# Patient Record
Sex: Female | Born: 1980
Health system: Southern US, Community
[De-identification: ages and names within clinical notes are randomized; demographics above are authoritative.]

## PROBLEM LIST (undated history)

## (undated) DIAGNOSIS — M1990 Unspecified osteoarthritis, unspecified site: Secondary | ICD-10-CM

## (undated) DIAGNOSIS — I10 Essential (primary) hypertension: Secondary | ICD-10-CM

## (undated) DIAGNOSIS — K219 Gastro-esophageal reflux disease without esophagitis: Secondary | ICD-10-CM

## (undated) DIAGNOSIS — I499 Cardiac arrhythmia, unspecified: Secondary | ICD-10-CM

## (undated) HISTORY — PX: TUBAL LIGATION: SHX77

## (undated) HISTORY — DX: Gastro-esophageal reflux disease without esophagitis: K21.9

## (undated) HISTORY — PX: ABDOMINAL HYSTERECTOMY: SHX81

## (undated) HISTORY — PX: KNEE SURGERY: SHX244

## (undated) HISTORY — DX: Essential (primary) hypertension: I10

## (undated) HISTORY — PX: OOPHORECTOMY: SHX86

---

## 2012-04-07 ENCOUNTER — Ambulatory Visit (INDEPENDENT_AMBULATORY_CARE_PROVIDER_SITE_OTHER): Payer: BC Managed Care – PPO | Admitting: Cardiology

## 2012-04-07 ENCOUNTER — Encounter: Payer: Self-pay | Admitting: Cardiology

## 2012-04-07 VITALS — BP 131/82 | HR 74 | Ht 65.0 in | Wt 126.8 lb

## 2012-04-07 DIAGNOSIS — I517 Cardiomegaly: Secondary | ICD-10-CM | POA: Insufficient documentation

## 2012-04-07 DIAGNOSIS — I10 Essential (primary) hypertension: Secondary | ICD-10-CM

## 2012-04-07 DIAGNOSIS — R9431 Abnormal electrocardiogram [ECG] [EKG]: Secondary | ICD-10-CM

## 2012-04-07 MED ORDER — AMLODIPINE BESYLATE 10 MG PO TABS: 10.0000 mg | ORAL_TABLET | Freq: Every day | ORAL | Status: DC

## 2012-04-07 NOTE — Patient Instructions (Addendum)
The current medical regimen is effective;  continue present plan and medications.  Your physician has requested that you have a renal artery duplex. During this test, an ultrasound is used to evaluate blood flow to the kidneys. Allow one hour for this exam. Do not eat after midnight the day before and avoid carbonated beverages. Take your medications as you usually do.  Please have 24 hour urine test.  Follow up in 1 month with Dr Antoine Poche

## 2012-04-07 NOTE — Progress Notes (Signed)
HPI The patient presents for followup of an abnormal echocardiogram. She has been living in [place]. She says she's been treated for years since she was 18 for hypertension. After careful questioning it does not seem like she has had a workup for secondary causes. She has had EKGs and a treadmill test in the past. She is now relocating here. She was seen at her primary care office and has had her medications adjusted recently to include increased Norvasc. She had an echocardiogram which reports an EF of 53%, LVH moderate and some trace tricuspid regurgitation. She was referred for further workup and followup of this and her hypertension.  The patient is quite active. She doesn't exercise but she raises 2 children, works 2 jobs and goes to school. She takes care of her home. With this level of activity she denies any chest pressure, neck or arm discomfort. She does not have shortness of breath, PND or orthopnea. She does have occasional pounding of her heart beat but not sustained tachyarrhythmias or necessarily irregular arrhythmias. She might get lightheaded with this. It might feel this way if she changes position or turns her head a certain way.  No Known Allergies  Current Outpatient Prescriptions  Medication Sig Dispense Refill  . amLODipine (NORVASC) 5 MG tablet Take 5 mg by mouth daily.      . busPIRone (BUSPAR) 15 MG tablet Take 15 mg by mouth 3 (three) times daily.      Marland Kitchen lisinopril (PRINIVIL,ZESTRIL) 20 MG tablet Take 20 mg by mouth daily.      . pantoprazole (PROTONIX) 40 MG tablet Take 40 mg by mouth daily.        Past Medical History  Diagnosis Date  . Palpitations   . Acid reflux     Past Surgical History  Procedure Date  . Abdominal hysterectomy     No family history on file.  History   Social History  . Marital Status: Married    Spouse Name: N/A    Number of Children: N/A  . Years of Education: N/A   Occupational History  . Not on file.   Social  History Main Topics  . Smoking status: Former Smoker    Quit date: 12/23/2011  . Smokeless tobacco: Not on file  . Alcohol Use: Not on file  . Drug Use: Not on file  . Sexually Active: Not on file   Other Topics Concern  . Not on file   Social History Narrative  . No narrative on file    ROS:  Positive for urinary frequency. Otherwise as stated in the history of present illness and negative for all other systems.  PHYSICAL EXAM BP 131/82  Pulse 74  Ht 5\' 5"  (1.651 m)  Wt 126 lb 12.8 oz (57.516 kg)  BMI 21.10 kg/m2 GENERAL:  Well appearing HEENT:  Pupils equal round and reactive, fundi not visualized, oral mucosa unremarkable NECK:  No jugular venous distention, waveform within normal limits, carotid upstroke brisk and symmetric, no bruits, no thyromegaly LYMPHATICS:  No cervical, inguinal adenopathy LUNGS:  Clear to auscultation bilaterally BACK:  No CVA tenderness CHEST:  Unremarkable HEART:  PMI not displaced or sustained,S1 and S2 within normal limits, no S3, no S4, no clicks, no rubs, no murmurs ABD:  Flat, positive bowel sounds normal in frequency in pitch, no bruits, no rebound, no guarding, no midline pulsatile mass, no hepatomegaly, no splenomegaly EXT:  2 plus pulses throughout, no edema, no cyanosis no clubbing SKIN:  No  rashes no nodules NEURO:  Cranial nerves II through XII grossly intact, motor grossly intact throughout PSYCH:  Cognitively intact, oriented to person place and time   EKG:  Sinus rhythm, rate 74, axis within normal limits, intervals within normal limits, no acute ST-T wave changes.  04/07/2012   ASSESSMENT AND PLAN  Hypertension - At this point I will pursue a workup for secondary causes starting with 24 hour urine for VMA and metanephrines and renal Dopplers to look for in particular fibromuscular dysplasia. She thinks she's not responding well to the 10 mg of Norvasc and that is contributing to her palpitations. I will change the timing of  this from morning to night to see if this helps but she will remain on the other medicines.  Of note last chemistry and TSH were normal.  Abnormal echocardiogram - She has a low ejection fraction but still low normal. I would not suspect ischemia. She does have evidence of end organ involvement with her hypertension would be LVH. She has no significant valvular abnormalities though I need to review the final report. At this point I'm not thinking she needs further workup other than above and a followup echo in the future. She certainly needs continued blood pressure control.  Palpitations - I will first manage this with changing the timing of her medications as listed above. Further workup will be based on future symptoms.

## 2012-04-13 ENCOUNTER — Other Ambulatory Visit: Payer: BC Managed Care – PPO

## 2012-04-17 LAB — CATECHOLAMINES, FRACTIONATED, URINE, 24 HOUR
Dopamine, 24 hr Urine: 140
Epinephrine, 24 hr Urine: 6.3
Total Volume - CF 24Hr U: 1500

## 2012-04-24 ENCOUNTER — Encounter (INDEPENDENT_AMBULATORY_CARE_PROVIDER_SITE_OTHER): Payer: BC Managed Care – PPO

## 2012-04-24 DIAGNOSIS — I10 Essential (primary) hypertension: Secondary | ICD-10-CM

## 2012-05-11 ENCOUNTER — Ambulatory Visit (INDEPENDENT_AMBULATORY_CARE_PROVIDER_SITE_OTHER): Payer: BC Managed Care – PPO | Admitting: Cardiology

## 2012-05-11 ENCOUNTER — Encounter: Payer: Self-pay | Admitting: Cardiology

## 2012-05-11 VITALS — BP 120/80 | HR 85 | Ht 65.0 in | Wt 131.2 lb

## 2012-05-11 DIAGNOSIS — I517 Cardiomegaly: Secondary | ICD-10-CM

## 2012-05-11 DIAGNOSIS — I10 Essential (primary) hypertension: Secondary | ICD-10-CM

## 2012-05-11 NOTE — Patient Instructions (Addendum)
The current medical regimen is effective;  continue present plan and medications.  Follow up in 6 months with Dr Hochrein.  You will receive a letter in the mail 2 months before you are due.  Please call us when you receive this letter to schedule your follow up appointment.  

## 2012-05-11 NOTE — Progress Notes (Signed)
   HPI The patient presents for a second visit with me to followup of an abnormal echocardiogram. She has been living in [place]. She says she's been treated for years since she was 18 for hypertension.  She has had EKGs and a treadmill test in the past. She has relocated here.. A recent echocardiogram repored an EF of 53%, LVH moderate and some trace tricuspid regurgitation.  I sent her for renal Dopplers which were negative for any evidence of obstruction. 24 hour urine for VMA and metanephrines was negative.  Since I last saw her she has done well.  The patient denies any new symptoms such as chest discomfort, neck or arm discomfort. There has been no new shortness of breath, PND or orthopnea. There have been no reported palpitations, presyncope or syncope.  No Known Allergies  Current Outpatient Prescriptions  Medication Sig Dispense Refill  . amLODipine (NORVASC) 10 MG tablet Take 1 tablet (10 mg total) by mouth daily.      . busPIRone (BUSPAR) 15 MG tablet Take 15 mg by mouth 3 (three) times daily.      Marland Kitchen lisinopril (PRINIVIL,ZESTRIL) 20 MG tablet Take 20 mg by mouth daily.      . pantoprazole (PROTONIX) 40 MG tablet Take 40 mg by mouth daily.      Marland Kitchen zolpidem (AMBIEN) 10 MG tablet Take 10 mg by mouth at bedtime as needed.        Past Medical History  Diagnosis Date  . HTN (hypertension)   . Acid reflux     Past Surgical History  Procedure Date  . Abdominal hysterectomy     ROS:  Positive for urinary frequency. Otherwise as stated in the history of present illness and negative for all other systems.  PHYSICAL EXAM BP 120/80  Pulse 85  Ht 5\' 5"  (1.651 m)  Wt 131 lb 3.2 oz (59.512 kg)  BMI 21.83 kg/m2 GENERAL:  Well appearing HEENT:  Pupils equal round and reactive, fundi not visualized, oral mucosa unremarkable NECK:  No jugular venous distention, waveform within normal limits, carotid upstroke brisk and symmetric, no bruits, no thyromegaly LUNGS:  Clear to  auscultation bilaterally BACK:  No CVA tenderness CHEST:  Unremarkable HEART:  PMI not displaced or sustained,S1 and S2 within normal limits, no S3, no S4, no clicks, no rubs, no murmurs ABD:  Flat, positive bowel sounds normal in frequency in pitch, no bruits, no rebound, no guarding, no midline pulsatile mass, no hepatomegaly, no splenomegaly EXT:  2 plus pulses throughout, no edema, no cyanosis no clubbing  EKG:  Sinus rhythm, rate 74, axis within normal limits, intervals within normal limits, no acute ST-T wave changes.  05/11/2012   ASSESSMENT AND PLAN  Hypertension - Her workup for secondary causes has been negative. Her blood pressure seems to be well-controlled regimen. No change in therapy is indicated.  Abnormal echocardiogram - She has LVH but this can regress as we control her BP.  We will reassess this in the future.   Palpitations - She has had no new symptoms. No further cardiovascular testing is suggested.

## 2012-09-19 ENCOUNTER — Emergency Department: Payer: Self-pay | Admitting: Emergency Medicine

## 2013-03-30 ENCOUNTER — Emergency Department: Payer: Self-pay | Admitting: Emergency Medicine

## 2014-08-25 ENCOUNTER — Emergency Department: Payer: Self-pay | Admitting: Emergency Medicine

## 2015-01-30 ENCOUNTER — Emergency Department: Payer: BLUE CROSS/BLUE SHIELD

## 2015-01-30 ENCOUNTER — Emergency Department
Admission: EM | Admit: 2015-01-30 | Discharge: 2015-01-30 | Disposition: A | Discharge status: Home / Self Care | Payer: BLUE CROSS/BLUE SHIELD | Attending: Emergency Medicine | Admitting: Emergency Medicine

## 2015-01-30 ENCOUNTER — Encounter: Payer: Self-pay | Admitting: Urgent Care

## 2015-01-30 DIAGNOSIS — M25562 Pain in left knee: Secondary | ICD-10-CM

## 2015-01-30 DIAGNOSIS — S8992XA Unspecified injury of left lower leg, initial encounter: Secondary | ICD-10-CM | POA: Diagnosis present

## 2015-01-30 DIAGNOSIS — I10 Essential (primary) hypertension: Secondary | ICD-10-CM | POA: Diagnosis not present

## 2015-01-30 DIAGNOSIS — Y998 Other external cause status: Secondary | ICD-10-CM | POA: Diagnosis not present

## 2015-01-30 DIAGNOSIS — X58XXXA Exposure to other specified factors, initial encounter: Secondary | ICD-10-CM | POA: Diagnosis not present

## 2015-01-30 DIAGNOSIS — Y9389 Activity, other specified: Secondary | ICD-10-CM | POA: Diagnosis not present

## 2015-01-30 DIAGNOSIS — Z720 Tobacco use: Secondary | ICD-10-CM | POA: Diagnosis not present

## 2015-01-30 DIAGNOSIS — S83207A Unspecified tear of unspecified meniscus, current injury, left knee, initial encounter: Secondary | ICD-10-CM | POA: Insufficient documentation

## 2015-01-30 DIAGNOSIS — Y9289 Other specified places as the place of occurrence of the external cause: Secondary | ICD-10-CM | POA: Diagnosis not present

## 2015-01-30 MED ORDER — IBUPROFEN 800 MG PO TABS: 800.0000 mg | ORAL_TABLET | Freq: Three times a day (TID) | ORAL | Status: DC | PRN

## 2015-01-30 MED ORDER — OXYCODONE-ACETAMINOPHEN 5-325 MG PO TABS: 1.0000 | ORAL_TABLET | Freq: Four times a day (QID) | ORAL | Status: DC | PRN

## 2015-01-30 NOTE — ED Notes (Signed)
Patient transported to X-ray 

## 2015-01-30 NOTE — ED Provider Notes (Signed)
[facility] Emergency Department Provider Note     Time seen: ----------------------------------------- 8:07 PM on 01/30/2015 -----------------------------------------    I have reviewed the triage vital signs and the nursing notes.   HISTORY  Chief Complaint Knee Pain    HPI Katie Lambert is a 34 y.o. female who presents ER after left knee pain. Patient reports she knelt down to kiss her daughter and she felt a severe pop and pain in her left knee. Patient reports pain mostly over the lateral aspect of the knee, has had several knee surgeries particularly on the left knee before. Denies any other complaints.   Past Medical History  Diagnosis Date  . HTN (hypertension)   . Acid reflux     Patient Active Problem List   Diagnosis Date Noted  . HTN (hypertension) 05/11/2012  . Left ventricular hypertrophy 04/07/2012    Past Surgical History  Procedure Laterality Date  . Abdominal hysterectomy    . Knee surgery Left     x 2    Allergies Review of patient's allergies indicates no known allergies.  Social History History  Substance Use Topics  . Smoking status: Current Every Day Smoker -- 1.00 packs/day for 15 years    Types: Cigarettes  . Smokeless tobacco: Not on file     Comment: Currently electronic cigarettes  . Alcohol Use: Yes    Review of Systems Constitutional: Negative for fever. Musculoskeletal: Positive for left knee pain Skin: Negative for rash. Neurological: Negative for headaches, focal weakness or numbness.   ____________________________________________   PHYSICAL EXAM:  VITAL SIGNS: ED Triage Vitals  Enc Vitals Group     BP 01/30/15 1958 144/96 mmHg     Pulse Rate 01/30/15 1958 86     Resp 01/30/15 1958 16     Temp --      Temp src --      SpO2 01/30/15 1958 97 %     Weight 01/30/15 1958 140 lb (63.504 kg)     Height 01/30/15 1958  (1.651 m)     Head Cir --      Peak Flow --      Pain Score  01/30/15 1958 8     Pain Loc --      Pain Edu? --      Excl. in GC? --     Constitutional: Alert and oriented. Well appearing and in no distress. Eyes: Conjunctivae are normal. Musculoskeletal: There is no obvious left knee effusion, patella is appropriate position. There is no pain with manipulation of the patella. Positive pain with lateral stress on the knee, pain relief with medial stress on a compartment. Negative anterior posterior drawer. Skin:  Skin is warm, dry and intact. No rash noted. ____________________________________________  ED COURSE:  Pertinent labs & imaging results that were available during my care of the patient were reviewed by me and considered in my medical decision making (see chart for details). Patient likely with meniscus injury versus LCL tear. We'll need left knee x-rays ____________________________________________    RADIOLOGY Images were viewed by me  Left knee x-rays are unremarkable  ____________________________________________  FINAL ASSESSMENT AND PLAN  Left knee pain  Plan: Patient with labs and imaging as dictated above. I suspect a meniscus injury over the lateral aspect of the knee. She is placed in knee immobilizer and crutches, she'll have referral to orthopedics. She'll need an MRI of the knee, Motrin and pain medication.   Emily Filbert, MD  Emily Filbert, MD 01/30/15 2101

## 2015-01-30 NOTE — ED Notes (Signed)
Patient presents with c/o LEFT knee pain. Patient reports that she "knelt down to kiss her child" and knee popped out of place. PMH significant for previous injuries to the same knee; has had 2 procedures in the past.

## 2015-01-30 NOTE — Discharge Instructions (Signed)
Knee Bracing °Knee braces are supports to help stabilize and protect an injured or painful knee. They come in many different styles. They should support and protect the knee without increasing the chance of other injuries to yourself or others. It is important not to have a false sense of security when using a brace. Knee braces that help you to keep using your knee: °· Do not restore normal knee stability under high stress forces. °· May decrease some aspects of athletic performance. °Some of the different types of knee braces are: °· Prophylactic knee braces are designed to prevent or reduce the severity of knee injuries during sports that make injury to the knee more likely. °· Rehabilitative knee braces are designed to allow protected motion of: °¨ Injured knees. °¨ Knees that have been treated with or without surgery. °There is no evidence that the use of a supportive knee brace protects the graft following a successful anterior cruciate ligament (ACL) reconstruction. However, braces are sometimes used to:  °· Protect injured ligaments. °· Control knee movement during the initial healing period. °They may be used as part of the treatment program for the various injured ligaments or cartilage of the knee including the: °· Anterior cruciate ligament. °· Medial collateral ligament. °· Medial or lateral cartilage (meniscus). °· Posterior cruciate ligament. °· Lateral collateral ligament. °Rehabilitative knee braces are most commonly used: °· During crutch-assisted walking right after injury. °· During crutch-assisted walking right after surgery to repair the cartilage and/or cruciate ligament injury. °· For a short period of time, 2-8 weeks, after the injury or surgery. °The value of a rehabilitative brace as opposed to a cast or splint includes the: °· Ability to adjust the brace for swelling. °· Ability to remove the brace for examinations, icing, or showering. °· Ability to allow for movement in a controlled  range of motion. °Functional knee braces give support to knees that have already been injured. They are designed to provide stability for the injured knee and provide protection after repair. Functional knee braces may not affect performance much. Lower extremity muscle strengthening, flexibility, and improvement in technique are more important than bracing in treating ligamentous knee injuries. Functional braces are not a substitute for rehabilitation or surgical procedures. °Unloader/off-loader braces are designed to provide pain relief in arthritic knees. Patients with wear and tear arthritis from growing old or from an old cartilage injury (osteoarthritis) of the knee, and bowlegged (varus) or knock-knee (valgus) deformities, often develop increased pain in the arthritic side due to increased loading. Unloader/off-loader braces are made to reduce uneven loading in such knees. There is reduction in bowing out movement in bowlegged knees when the correct unloader brace is used. Patients with advanced osteoarthritis or severe varus or valgus alignment problems would not likely benefit from bracing. °Patellofemoral braces help the kneecap to move smoothly and well centered over the end of the femur in the knee.  °Most people who wear knee braces feel that they help. However, there is a lack of scientific evidence that knee braces are helpful at the level needed for athletic participation to prevent injury. In spite of this, athletes report an increase in knee stability, pain relief, performance improvement, and confidence during athletics when using a brace.  °Different knee problems require different knee braces: °· Your caregiver may suggest one kind of knee brace after knee surgery. °· A caregiver may choose another kind of knee brace for support instead of surgery for some types of torn ligaments. °· You may   also need one for pain in the front of your knee that is not getting better with strengthening and  flexibility exercises. °Get your caregiver's advice if you want to try a knee brace. The caregiver will advise you on where to get them and provide a prescription when it is needed to fashion and/or fit the brace. °Knee braces are the least important part of preventing knee injuries or getting better following injury. Stretching, strengthening and technique improvement are far more important in caring for and preventing knee injuries. When strengthening your knee, increase your activities a little at a time so as not to develop injuries from overuse. Work out an exercise plan with your caregiver and/or physical therapist to get the best program for you. Do not let a knee brace become a crutch. °Always remember, there are no braces which support the knee as well as your original ligaments and cartilage you were born with. Conditioning, proper warm-up, and stretching remain the most important parts of keeping your knees healthy. °HOW TO USE A KNEE BRACE °· During sports, knee braces should be used as directed by your caregiver. °· Make sure that the hinges are where the knee bends. °· Straps, tapes, or hook-and-loop tapes should be fastened around your leg as instructed. °· You should check the placement of the brace during activities to make sure that it has not moved. Poorly positioned braces can hurt rather than help you. °· To work well, a knee brace should be worn during all activities that put you at risk of knee injury. °· Warm up properly before beginning athletic activities. °HOME CARE INSTRUCTIONS °· Knee braces often get damaged during normal use. Replace worn-out braces for maximum benefit. °· Clean regularly with soap and water. °· Inspect your brace often for wear and tear. °· Cover exposed metal to protect others from injury. °· Durable materials may cost more, but last longer. °SEEK IMMEDIATE MEDICAL CARE IF:  °· Your knee seems to be getting worse rather than better. °· You have increasing pain or  swelling in the knee. °· You have problems caused by the knee brace. °· You have increased swelling or inflammation (redness or soreness) in your knee. °· Your knee becomes warm and more painful and you develop an unexplained temperature over 101°F (38.3°C). °MAKE SURE YOU:  °· Understand these instructions. °· Will watch your condition. °· Will get help right away if you are not doing well or get worse. °See your caregiver, physical therapist, or orthopedic surgeon for additional information. °Document Released: 08/31/2003 Document Revised: 10/25/2013 Document Reviewed: 12/07/2008 °[employer]® Patient Information ©2015 [employer]. This information is not intended to replace advice given to you by your health care provider. Make sure you discuss any questions you have with your health care provider. ° °Knee Pain °The knee is the complex joint between your thigh and your lower leg. It is made up of bones, tendons, ligaments, and cartilage. The bones that make up the knee are: °· The femur in the thigh. °· The tibia and fibula in the lower leg. °· The patella or kneecap riding in the groove on the lower femur. °CAUSES  °Knee pain is a common complaint with many causes. A few of these causes are: °· Injury, such as: °¨ A ruptured ligament or tendon injury. °¨ Torn cartilage. °· Medical conditions, such as: °¨ Gout °¨ Arthritis °¨ Infections °· Overuse, over training, or overdoing a physical activity. °Knee pain can be minor or severe. Knee pain can   accompany debilitating injury. Minor knee problems often respond well to self-care measures or get well on their own. More serious injuries may need medical intervention or even surgery. °SYMPTOMS °The knee is complex. Symptoms of knee problems can vary widely. Some of the problems are: °· Pain with movement and weight bearing. °· Swelling and tenderness. °· Buckling of the knee. °· Inability to straighten or extend your knee. °· Your knee locks and you cannot straighten  it. °· Warmth and redness with pain and fever. °· Deformity or dislocation of the kneecap. °DIAGNOSIS  °Determining what is wrong may be very straight forward such as when there is an injury. It can also be challenging because of the complexity of the knee. Tests to make a diagnosis may include: °· Your caregiver taking a history and doing a physical exam. °· Routine X-rays can be used to rule out other problems. X-rays will not reveal a cartilage tear. Some injuries of the knee can be diagnosed by: °¨ Arthroscopy a surgical technique by which a small video camera is inserted through tiny incisions on the sides of the knee. This procedure is used to examine and repair internal knee joint problems. Tiny instruments can be used during arthroscopy to repair the torn knee cartilage (meniscus). °¨ Arthrography is a radiology technique. A contrast liquid is directly injected into the knee joint. Internal structures of the knee joint then become visible on X-ray film. °¨ An MRI scan is a non X-ray radiology procedure in which magnetic fields and a computer produce two- or three-dimensional images of the inside of the knee. Cartilage tears are often visible using an MRI scanner. MRI scans have largely replaced arthrography in diagnosing cartilage tears of the knee. °· Blood work. °· Examination of the fluid that helps to lubricate the knee joint (synovial fluid). This is done by taking a sample out using a needle and a syringe. °TREATMENT °The treatment of knee problems depends on the cause. Some of these treatments are: °· Depending on the injury, proper casting, splinting, surgery, or physical therapy care will be needed. °· Give yourself adequate recovery time. Do not overuse your joints. If you begin to get sore during workout routines, back off. Slow down or do fewer repetitions. °· For repetitive activities such as cycling or running, maintain your strength and nutrition. °· Alternate muscle groups. For example, if  you are a weight lifter, work the upper body on one day and the lower body the next. °· Either tight or weak muscles do not give the proper support for your knee. Tight or weak muscles do not absorb the stress placed on the knee joint. Keep the muscles surrounding the knee strong. °· Take care of mechanical problems. °¨ If you have flat feet, orthotics or special shoes may help. See your caregiver if you need help. °¨ Arch supports, sometimes with wedges on the inner or outer aspect of the heel, can help. These can shift pressure away from the side of the knee most bothered by osteoarthritis. °¨ A brace called an "unloader" brace also may be used to help ease the pressure on the most arthritic side of the knee. °· If your caregiver has prescribed crutches, braces, wraps or ice, use as directed. The acronym for this is PRICE. This means protection, rest, ice, compression, and elevation. °· Nonsteroidal anti-inflammatory drugs (NSAIDs), can help relieve pain. But if taken immediately after an injury, they may actually increase swelling. Take NSAIDs with food in your stomach. Stop them   if you develop stomach problems. Do not take these if you have a history of ulcers, stomach pain, or bleeding from the bowel. Do not take without your caregiver's approval if you have problems with fluid retention, heart failure, or kidney problems. °· For ongoing knee problems, physical therapy may be helpful. °· Glucosamine and chondroitin are over-the-counter dietary supplements. Both may help relieve the pain of osteoarthritis in the knee. These medicines are different from the usual anti-inflammatory drugs. Glucosamine may decrease the rate of cartilage destruction. °· Injections of a corticosteroid drug into your knee joint may help reduce the symptoms of an arthritis flare-up. They may provide pain relief that lasts a few months. You may have to wait a few months between injections. The injections do have a small increased risk of  infection, water retention, and elevated blood sugar levels. °· Hyaluronic acid injected into damaged joints may ease pain and provide lubrication. These injections may work by reducing inflammation. A series of shots may give relief for as long as 6 months. °· Topical painkillers. Applying certain ointments to your skin may help relieve the pain and stiffness of osteoarthritis. Ask your pharmacist for suggestions. Many over the-counter products are approved for temporary relief of arthritis pain. °· In some countries, doctors often prescribe topical NSAIDs for relief of chronic conditions such as arthritis and tendinitis. A review of treatment with NSAID creams found that they worked as well as oral medications but without the serious side effects. °PREVENTION °· Maintain a healthy weight. Extra pounds put more strain on your joints. °· Get strong, stay limber. Weak muscles are a common cause of knee injuries. Stretching is important. Include flexibility exercises in your workouts. °· Be smart about exercise. If you have osteoarthritis, chronic knee pain or recurring injuries, you may need to change the way you exercise. This does not mean you have to stop being active. If your knees ache after jogging or playing basketball, consider switching to swimming, water aerobics, or other low-impact activities, at least for a few days a week. Sometimes limiting high-impact activities will provide relief. °· Make sure your shoes fit well. Choose footwear that is right for your sport. °· Protect your knees. Use the proper gear for knee-sensitive activities. Use kneepads when playing volleyball or laying carpet. Buckle your seat belt every time you drive. Most shattered kneecaps occur in car accidents. °· Rest when you are tired. °SEEK MEDICAL CARE IF:  °You have knee pain that is continual and does not seem to be getting better.  °SEEK IMMEDIATE MEDICAL CARE IF:  °Your knee joint feels hot to the touch and you have a high  fever. °MAKE SURE YOU:  °· Understand these instructions. °· Will watch your condition. °· Will get help right away if you are not doing well or get worse. °Document Released: 04/07/2007 Document Revised: 09/02/2011 Document Reviewed: 04/07/2007 °[employer]® Patient Information ©2015 [employer]. This information is not intended to replace advice given to you by your health care provider. Make sure you discuss any questions you have with your health care provider. ° °

## 2015-01-31 ENCOUNTER — Other Ambulatory Visit: Payer: Self-pay | Admitting: Physician Assistant

## 2015-01-31 DIAGNOSIS — M25562 Pain in left knee: Secondary | ICD-10-CM

## 2015-02-01 ENCOUNTER — Ambulatory Visit
Admission: RE | Admit: 2015-02-01 | Discharge: 2015-02-01 | Disposition: A | Discharge status: Home / Self Care | Payer: BLUE CROSS/BLUE SHIELD | Source: Ambulatory Visit | Attending: Physician Assistant | Admitting: Physician Assistant

## 2015-02-01 DIAGNOSIS — S83272A Complex tear of lateral meniscus, current injury, left knee, initial encounter: Secondary | ICD-10-CM | POA: Insufficient documentation

## 2015-02-01 DIAGNOSIS — M25562 Pain in left knee: Secondary | ICD-10-CM | POA: Diagnosis present

## 2015-02-01 DIAGNOSIS — M25862 Other specified joint disorders, left knee: Secondary | ICD-10-CM | POA: Insufficient documentation

## 2015-02-01 DIAGNOSIS — M1712 Unilateral primary osteoarthritis, left knee: Secondary | ICD-10-CM | POA: Insufficient documentation

## 2015-03-01 ENCOUNTER — Encounter
Admission: RE | Admit: 2015-03-01 | Discharge: 2015-03-01 | Disposition: A | Discharge status: Home / Self Care | Payer: BLUE CROSS/BLUE SHIELD | Source: Ambulatory Visit | Attending: Orthopedic Surgery | Admitting: Orthopedic Surgery

## 2015-03-01 DIAGNOSIS — Z01810 Encounter for preprocedural cardiovascular examination: Secondary | ICD-10-CM | POA: Diagnosis present

## 2015-03-01 DIAGNOSIS — Z01812 Encounter for preprocedural laboratory examination: Secondary | ICD-10-CM | POA: Insufficient documentation

## 2015-03-01 HISTORY — DX: Unspecified osteoarthritis, unspecified site: M19.90

## 2015-03-01 HISTORY — DX: Cardiac arrhythmia, unspecified: I49.9

## 2015-03-01 LAB — URINALYSIS COMPLETE WITH MICROSCOPIC (ARMC ONLY)
BACTERIA UA: NONE SEEN
Bilirubin Urine: NEGATIVE
Glucose, UA: NEGATIVE mg/dL
Hgb urine dipstick: NEGATIVE
Ketones, ur: NEGATIVE mg/dL
Leukocytes, UA: NEGATIVE
Nitrite: NEGATIVE
PROTEIN: NEGATIVE mg/dL
SQUAMOUS EPITHELIAL / LPF: NONE SEEN
Specific Gravity, Urine: 1.005 (ref 1.005–1.030)
pH: 7 (ref 5.0–8.0)

## 2015-03-01 LAB — CBC
HCT: 42 % (ref 35.0–47.0)
Hemoglobin: 14 g/dL (ref 12.0–16.0)
MCH: 31.1 pg (ref 26.0–34.0)
MCHC: 33.2 g/dL (ref 32.0–36.0)
MCV: 93.4 fL (ref 80.0–100.0)
PLATELETS: 227 10*3/uL (ref 150–440)
RBC: 4.49 MIL/uL (ref 3.80–5.20)
RDW: 12.8 % (ref 11.5–14.5)
WBC: 7.2 10*3/uL (ref 3.6–11.0)

## 2015-03-01 LAB — BASIC METABOLIC PANEL
Anion gap: 8 (ref 5–15)
BUN: 13 mg/dL (ref 6–20)
CALCIUM: 9.5 mg/dL (ref 8.9–10.3)
CO2: 28 mmol/L (ref 22–32)
CREATININE: 0.77 mg/dL (ref 0.44–1.00)
Chloride: 103 mmol/L (ref 101–111)
GFR calc non Af Amer: >60 mL/min (ref >60)
Glucose, Bld: 94 mg/dL (ref 65–99)
Potassium: 3.8 mmol/L (ref 3.5–5.1)
SODIUM: 139 mmol/L (ref 135–145)

## 2015-03-01 LAB — APTT: aPTT: 27 seconds (ref 24–36)

## 2015-03-01 LAB — PROTIME-INR
INR: 0.95
PROTHROMBIN TIME: 12.9 s (ref 11.4–15.0)

## 2015-03-01 NOTE — Patient Instructions (Signed)
  Your procedure is scheduled on: March 08, 2015 (Wednesday) Report to Day Surgery. [facility]) To find out your arrival time please call [phone] between 1PM - 3PM on March 07, 2015 (Tuesday).  Remember: Instructions that are not followed completely may result in serious medical risk, up to and including death, or upon the discretion of your surgeon and anesthesiologist your surgery may need to be rescheduled.    __x__ 1. Do not eat food or drink liquids after midnight. No gum chewing or hard candies.     __x__ 2. No Alcohol for 24 hours before or after surgery.   ____ 3. Bring all medications with you on the day of surgery if instructed.    __x__ 4. Notify your doctor if there is any change in your medical condition     (cold, fever, infections).     Do not wear jewelry, make-up, hairpins, clips or nail polish.  Do not wear lotions, powders, or perfumes. You may wear deodorant.  Do not shave 48 hours prior to surgery. Men may shave face and neck.  Do not bring valuables to the hospital.    [facility] is not responsible for any belongings or valuables.               Contacts, dentures or bridgework may not be worn into surgery.  Leave your suitcase in the car. After surgery it may be brought to your room.  For patients admitted to the hospital, discharge time is determined by your                treatment team.   Patients discharged the day of surgery will not be allowed to drive home.   Please read over the following fact sheets that you were given:   Surgical Site Infection Prevention   ____ Take these medicines the morning of surgery with A SIP OF WATER:    1. Lisinopril  2.   3.   4.  5.  6.  ____ Fleet Enema (as directed)   __x__ Use CHG Soap as directed  ____ Use inhalers on the day of surgery  ____ Stop metformin 2 days prior to surgery    ____ Take 1/2 of usual insulin dose the night before surgery and none on the morning of surgery.   ____  Stop Coumadin/Plavix/aspirin on   __x__ Stop Anti-inflammatories on (STOP IBUPROFEN NOW)   ____ Stop supplements until after surgery.    ____ Bring C-Pap to the hospital.

## 2015-03-08 ENCOUNTER — Ambulatory Visit: Payer: BLUE CROSS/BLUE SHIELD | Admitting: Anesthesiology

## 2015-03-08 ENCOUNTER — Encounter: Payer: Self-pay | Admitting: *Deleted

## 2015-03-08 ENCOUNTER — Encounter
Admission: RE | Disposition: A | Discharge status: Home / Self Care | Payer: Self-pay | Source: Ambulatory Visit | Attending: Orthopedic Surgery

## 2015-03-08 ENCOUNTER — Ambulatory Visit
Admission: RE | Admit: 2015-03-08 | Discharge: 2015-03-08 | Disposition: A | Discharge status: Home / Self Care | Payer: BLUE CROSS/BLUE SHIELD | Source: Ambulatory Visit | Attending: Orthopedic Surgery | Admitting: Orthopedic Surgery

## 2015-03-08 DIAGNOSIS — S83289A Other tear of lateral meniscus, current injury, unspecified knee, initial encounter: Secondary | ICD-10-CM | POA: Diagnosis present

## 2015-03-08 DIAGNOSIS — F17210 Nicotine dependence, cigarettes, uncomplicated: Secondary | ICD-10-CM | POA: Diagnosis not present

## 2015-03-08 DIAGNOSIS — I499 Cardiac arrhythmia, unspecified: Secondary | ICD-10-CM | POA: Diagnosis not present

## 2015-03-08 DIAGNOSIS — Z8249 Family history of ischemic heart disease and other diseases of the circulatory system: Secondary | ICD-10-CM | POA: Insufficient documentation

## 2015-03-08 DIAGNOSIS — Z79899 Other long term (current) drug therapy: Secondary | ICD-10-CM | POA: Diagnosis not present

## 2015-03-08 DIAGNOSIS — Z8489 Family history of other specified conditions: Secondary | ICD-10-CM | POA: Insufficient documentation

## 2015-03-08 DIAGNOSIS — S83252A Bucket-handle tear of lateral meniscus, current injury, left knee, initial encounter: Secondary | ICD-10-CM | POA: Diagnosis not present

## 2015-03-08 DIAGNOSIS — X58XXXA Exposure to other specified factors, initial encounter: Secondary | ICD-10-CM | POA: Insufficient documentation

## 2015-03-08 DIAGNOSIS — M1990 Unspecified osteoarthritis, unspecified site: Secondary | ICD-10-CM | POA: Insufficient documentation

## 2015-03-08 DIAGNOSIS — I10 Essential (primary) hypertension: Secondary | ICD-10-CM | POA: Insufficient documentation

## 2015-03-08 DIAGNOSIS — K219 Gastro-esophageal reflux disease without esophagitis: Secondary | ICD-10-CM | POA: Insufficient documentation

## 2015-03-08 HISTORY — PX: KNEE ARTHROSCOPY: SHX127

## 2015-03-08 SURGERY — ARTHROSCOPY, KNEE
Anesthesia: General | Laterality: Left

## 2015-03-08 MED ORDER — FAMOTIDINE 20 MG PO TABS
ORAL_TABLET | ORAL | Status: AC
  Administered 2015-03-08: 20 mg via ORAL
  Filled 2015-03-08: qty 1

## 2015-03-08 MED ORDER — CEFAZOLIN SODIUM 1-5 GM-% IV SOLN: 1.0000 g | Freq: Once | INTRAVENOUS | Status: DC

## 2015-03-08 MED ORDER — PROPOFOL 10 MG/ML IV BOLUS
INTRAVENOUS | Status: DC | PRN
  Administered 2015-03-08: 130 mg via INTRAVENOUS

## 2015-03-08 MED ORDER — FENTANYL CITRATE (PF) 100 MCG/2ML IJ SOLN
INTRAMUSCULAR | Status: AC
  Filled 2015-03-08: qty 2

## 2015-03-08 MED ORDER — FAMOTIDINE 20 MG PO TABS
20.0000 mg | ORAL_TABLET | Freq: Once | ORAL | Status: AC
  Administered 2015-03-08: 20 mg via ORAL

## 2015-03-08 MED ORDER — OXYCODONE HCL 5 MG PO TABS
ORAL_TABLET | ORAL | Status: AC
  Filled 2015-03-08: qty 1

## 2015-03-08 MED ORDER — FENTANYL CITRATE (PF) 100 MCG/2ML IJ SOLN
25.0000 ug | INTRAMUSCULAR | Status: AC | PRN
  Administered 2015-03-08 (×6): 25 ug via INTRAVENOUS

## 2015-03-08 MED ORDER — BUPIVACAINE-EPINEPHRINE (PF) 0.25% -1:200000 IJ SOLN
INTRAMUSCULAR | Status: AC
  Filled 2015-03-08: qty 30

## 2015-03-08 MED ORDER — ASPIRIN EC 325 MG PO TBEC: 325.0000 mg | DELAYED_RELEASE_TABLET | Freq: Every day | ORAL | Status: DC

## 2015-03-08 MED ORDER — LACTATED RINGERS IV SOLN
INTRAVENOUS | Status: DC
Start: 2015-03-08 — End: 2015-03-08
  Administered 2015-03-08: 13:00:00 via INTRAVENOUS

## 2015-03-08 MED ORDER — HYDROMORPHONE HCL 1 MG/ML IJ SOLN
0.5000 mg | INTRAMUSCULAR | Status: AC | PRN
  Administered 2015-03-08 (×4): 0.5 mg via INTRAVENOUS

## 2015-03-08 MED ORDER — LIDOCAINE HCL 1 % IJ SOLN
INTRAMUSCULAR | Status: DC | PRN
  Administered 2015-03-08: 9 mL

## 2015-03-08 MED ORDER — BUPIVACAINE HCL (PF) 0.25 % IJ SOLN
INTRAMUSCULAR | Status: AC
  Filled 2015-03-08: qty 30

## 2015-03-08 MED ORDER — FENTANYL CITRATE (PF) 100 MCG/2ML IJ SOLN
INTRAMUSCULAR | Status: DC | PRN
  Administered 2015-03-08 (×2): 50 ug via INTRAVENOUS

## 2015-03-08 MED ORDER — OXYCODONE-ACETAMINOPHEN 5-325 MG PO TABS
1.0000 | ORAL_TABLET | ORAL | Status: DC | PRN
  Administered 2015-03-08: 1 via ORAL

## 2015-03-08 MED ORDER — DEXAMETHASONE SODIUM PHOSPHATE 4 MG/ML IJ SOLN
INTRAMUSCULAR | Status: DC | PRN
  Administered 2015-03-08: 5 mg via INTRAVENOUS

## 2015-03-08 MED ORDER — HYDROMORPHONE HCL 1 MG/ML IJ SOLN
INTRAMUSCULAR | Status: AC
  Filled 2015-03-08: qty 1

## 2015-03-08 MED ORDER — ONDANSETRON HCL 4 MG/2ML IJ SOLN: 4.0000 mg | Freq: Once | INTRAMUSCULAR | Status: DC | PRN

## 2015-03-08 MED ORDER — LIDOCAINE HCL (CARDIAC) 20 MG/ML IV SOLN
INTRAVENOUS | Status: DC | PRN
  Administered 2015-03-08: 50 mg via INTRAVENOUS

## 2015-03-08 MED ORDER — EPHEDRINE SULFATE 50 MG/ML IJ SOLN
INTRAMUSCULAR | Status: DC | PRN
  Administered 2015-03-08: 5 mg via INTRAVENOUS

## 2015-03-08 MED ORDER — BUPIVACAINE-EPINEPHRINE 0.25% -1:200000 IJ SOLN
INTRAMUSCULAR | Status: DC | PRN
  Administered 2015-03-08: 30 mL

## 2015-03-08 MED ORDER — LIDOCAINE HCL (PF) 1 % IJ SOLN
INTRAMUSCULAR | Status: AC
  Filled 2015-03-08: qty 30

## 2015-03-08 MED ORDER — OXYCODONE HCL 5 MG PO TABS: 5.0000 mg | ORAL_TABLET | ORAL | Status: DC | PRN

## 2015-03-08 MED ORDER — ONDANSETRON HCL 4 MG/2ML IJ SOLN
INTRAMUSCULAR | Status: DC | PRN
  Administered 2015-03-08: 4 mg via INTRAVENOUS

## 2015-03-08 MED ORDER — ONDANSETRON HCL 4 MG PO TABS: 4.0000 mg | ORAL_TABLET | Freq: Three times a day (TID) | ORAL | Status: DC | PRN

## 2015-03-08 MED ORDER — MIDAZOLAM HCL 2 MG/2ML IJ SOLN
INTRAMUSCULAR | Status: DC | PRN
  Administered 2015-03-08: 2 mg via INTRAVENOUS

## 2015-03-08 MED ORDER — CEFAZOLIN SODIUM 1-5 GM-% IV SOLN
INTRAVENOUS | Status: AC
  Administered 2015-03-08: 1 g via INTRAVENOUS
  Filled 2015-03-08: qty 50

## 2015-03-08 SURGICAL SUPPLY — 39 items
BUR RADIUS 3.5 (BURR) ×3 IMPLANT
BUR RADIUS 4.0X18.5 (BURR) ×3 IMPLANT
CLOSURE WOUND 1/2 X4 (GAUZE/BANDAGES/DRESSINGS) ×1
COOLER POLAR GLACIER W/PUMP (MISCELLANEOUS) ×3 IMPLANT
DRAPE IMP U-DRAPE 54X76 (DRAPES) ×3 IMPLANT
DURAPREP 26ML APPLICATOR (WOUND CARE) ×9 IMPLANT
FASTFIX NDL DEL SYS 360 CVD (Miscellaneous) ×15 IMPLANT
FASTFIX NDL DEL SYS 360 STRT (Miscellaneous) ×12 IMPLANT
FIXAT NDL DEL SYS CVD (MISCELLANEOUS) ×3
GAUZE PETRO XEROFOAM 1X8 (MISCELLANEOUS) ×3 IMPLANT
GAUZE SPONGE 4X4 12PLY STRL (GAUZE/BANDAGES/DRESSINGS) ×3 IMPLANT
GLOVE BIOGEL PI IND STRL 9 (GLOVE) ×1 IMPLANT
GLOVE BIOGEL PI INDICATOR 9 (GLOVE) ×2
GLOVE SURG 9.0 ORTHO LTXF (GLOVE) ×3 IMPLANT
GOWN STRL REUS W/ TWL LRG LVL3 (GOWN DISPOSABLE) ×1 IMPLANT
GOWN STRL REUS W/TWL 2XL LVL3 (GOWN DISPOSABLE) ×3 IMPLANT
GOWN STRL REUS W/TWL LRG LVL3 (GOWN DISPOSABLE) ×2
IV LACTATED RINGER IRRG 3000ML (IV SOLUTION) ×12
IV LR IRRIG 3000ML ARTHROMATIC (IV SOLUTION) ×6 IMPLANT
KIT RM TURNOVER STRD PROC AR (KITS) ×3 IMPLANT
MANIFOLD NEPTUNE II (INSTRUMENTS) ×3 IMPLANT
NEEDLE CRVD FSTFX SPLT CANNULA (MISCELLANEOUS) ×1 IMPLANT
PACK ARTHROSCOPY KNEE (MISCELLANEOUS) ×3 IMPLANT
PAD ABD DERMACEA PRESS 5X9 (GAUZE/BANDAGES/DRESSINGS) ×6 IMPLANT
PAD WRAPON POLAR KNEE (MISCELLANEOUS) ×1 IMPLANT
PUSHER KNOT ARTHRO 360DEG (Miscellaneous) ×3 IMPLANT
SET TUBE SUCT SHAVER OUTFL 24K (TUBING) ×3 IMPLANT
SOL PREP PVP 2OZ (MISCELLANEOUS) ×3
SOLUTION PREP PVP 2OZ (MISCELLANEOUS) ×1 IMPLANT
STRIP CLOSURE SKIN 1/2X4 (GAUZE/BANDAGES/DRESSINGS) ×2 IMPLANT
SUT ETHILON 4-0 (SUTURE) ×2
SUT ETHILON 4-0 FS2 18XMFL BLK (SUTURE) ×1
SUTURE ETHLN 4-0 FS2 18XMF BLK (SUTURE) ×1 IMPLANT
SYSTEM NDL DEL FSTFX  360 CVD (Miscellaneous) ×5 IMPLANT
SYSTEM NDL DEL FSTFX  360 STRT (Miscellaneous) ×4 IMPLANT
TUBING ARTHRO INFLOW-ONLY STRL (TUBING) ×3 IMPLANT
WAND HAND CNTRL MULTIVAC 50 (MISCELLANEOUS) ×3 IMPLANT
WAND HAND CNTRL MULTIVAC 90 (MISCELLANEOUS) ×3 IMPLANT
WRAPON POLAR PAD KNEE (MISCELLANEOUS) ×3

## 2015-03-08 NOTE — Progress Notes (Signed)
  Subjective:  Patient doing well in PACU. Patient reports pain as moderate.    Objective:   VITALS:   Filed Vitals:   03/08/15 1745 03/08/15 1800 03/08/15 1810 03/08/15 1824  BP:   140/90 135/95  Pulse: 70 82  85  Temp:   98.6 F (37 C) 96.1 F (35.6 C)  TempSrc:    Tympanic  Resp: Height:      Weight:      SpO2: 97% 97%  96%    PHYSICAL EXAM:  Examination left knee lower extremity reveals patient is neurovascular intact. She has palpable pedal pulses. She can dorsiflex and plantarflex actively her left ankle. She can flex and extend her toes. She has intact sensation throughout on the left foot. Dressing is clean and dry. His knee braces in place along with a Polar Care.  LABS  No results found for this or any previous visit (from the past 24 hour(s)).  No results found.  Assessment/Plan: Day of Surgery   Active Problems:   * No active hospital problems. *   I reviewed the findings of the operation with the patient. She understands that she had a large meniscal tear requiring repair. She also understands that she has advanced arthritis of the patellofemoral joint as well as chondral lesions in the medial femoral condyle and lateral femoral condyle. Patient taken take an aspirin 325 mg daily for DVT prophylaxis. She'll follow up with me in approximately 1 week for wound check and suture removal. I answered all the patient's questions.    Juanell Fairly , MD 03/08/2015, 6:32 PM

## 2015-03-08 NOTE — Anesthesia Procedure Notes (Signed)
Procedure Name: LMA Insertion Date/Time: 03/08/2015 2:26 PM Performed by: Omer Jack Pre-anesthesia Checklist: Patient identified, Patient being monitored, Timeout performed, Emergency Drugs available and Suction available Patient Re-evaluated:Patient Re-evaluated prior to inductionOxygen Delivery Method: Circle system utilized Preoxygenation: Pre-oxygenation with 100% oxygen Intubation Type: IV induction Ventilation: Mask ventilation without difficulty LMA: LMA inserted LMA Size: 3.5 Tube type: Oral Number of attempts: 1 Placement Confirmation: positive ETCO2 and breath sounds checked- equal and bilateral Tube secured with: Tape Dental Injury: Teeth and Oropharynx as per pre-operative assessment

## 2015-03-08 NOTE — Transfer of Care (Signed)
Immediate Anesthesia Transfer of Care Note  Patient: Katie Lambert  Procedure(s) Performed: Procedure(s): ARTHROSCOPY KNEE (Left)  Patient Location: PACU  Anesthesia Type:General  Level of Consciousness: awake, alert  and oriented  Airway & Oxygen Therapy: Patient Spontanous Breathing and Patient connected to face mask oxygen  Post-op Assessment: Report given to RN and Post -op Vital signs reviewed and stable  Post vital signs: Reviewed and stable  Last Vitals:  Filed Vitals:   03/08/15 1649  BP: 135/79  Pulse: 87  Temp: 36.4 C  Resp: 21    Complications: No apparent anesthesia complications

## 2015-03-08 NOTE — H&P (Signed)
PREOPERATIVE H&P  Chief Complaint: TEAR OF LATERAL MENISCUS  HPI: Katie Lambert is a 34 y.o. female who presents for preoperative history and physical with a diagnosis of TEAR OF LATERAL MENISCUS, LEFT KNEE. Symptoms include pain and mechanical symptoms in the left knee which improved with nonoperative management.  This is significantly impairing her performance at work.  She has elected for surgical management.  Past Medical History  Diagnosis Date  . HTN (hypertension)   . Acid reflux   . Dysrhythmia   . Arthritis    Past Surgical History  Procedure Laterality Date  . Abdominal hysterectomy    . Knee surgery Left     x 2   Social History   Social History  . Marital Status: Divorced    Spouse Name: N/A  . Number of Children: 2  . Years of Education: N/A   Occupational History  .     Social History Main Topics  . Smoking status: Current Every Day Smoker -- 0.50 packs/day for 15 years    Types: Cigarettes  . Smokeless tobacco: Never Used     Comment: Currently electronic cigarettes  . Alcohol Use: Yes     Comment: social  . Drug Use: No  . Sexual Activity: Not Asked   Other Topics Concern  . None   Social History Narrative   Works two jobs, 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   Family History  Problem Relation Age of Onset  . Hypertension Father   . Depression Mother   . Hypertension Mother   . Coronary artery disease Maternal Grandfather 50   No Known Allergies Prior to Admission medications   Medication Sig Start Date End Date Taking? Authorizing Provider  amLODipine (NORVASC) 10 MG tablet Take 1 tablet (10 mg total) by mouth daily. Patient taking differently: Take 10 mg by mouth at bedtime.  04/07/12  Yes Rollene Rotunda, MD  lisinopril (PRINIVIL,ZESTRIL) 20 MG tablet Take 20 mg by mouth daily.   Yes Historical Provider, MD  loratadine (CLARITIN) 10 MG tablet Take 10 mg by mouth daily.   Yes Historical Provider, MD  oxyCODONE-acetaminophen  (ROXICET) 5-325 MG per tablet Take 1 tablet by mouth every 6 (six) hours as needed. 01/30/15  Yes Emily Filbert, MD  ibuprofen (ADVIL,MOTRIN) 800 MG tablet Take 1 tablet (800 mg total) by mouth every 8 (eight) hours as needed. 01/30/15   Emily Filbert, MD  zolpidem (AMBIEN) 10 MG tablet Take 10 mg by mouth at bedtime as needed.    Historical Provider, MD     Positive ROS: All other systems have been reviewed and were otherwise negative with the exception of those mentioned in the HPI and as above.  Physical Exam: General: Alert, no acute distress Cardiovascular: Regular rate and rhythm, no murmurs rubs or gallops.  No pedal edema Respiratory: Clear to auscultation bilaterally, no wheezes rales or rhonchi. No cyanosis, no use of accessory musculature GI: No organomegaly, abdomen is soft and non-tender nondistended with positive bowel sounds. Skin: Skin intact, no lesions within the operative field. Neurologic: Sensation intact distally Psychiatric: Patient is competent for consent with normal mood and affect Lymphatic: No axillary or cervical lymphadenopathy  MUSCULOSKELETAL: Left knee: Patient is no erythema or ecchymosis or effusion. Range of motion is 0-120. She is pointing to the lateral joint line pain laterally with McMurray's test.  She has intact sensation to light touch, 5 out of 5 strength in all muscle groups and palpable pedal pulses.  There is no calf tenderness or lower leg edema.   Assessment: TEAR OF LATERAL MENISCUS, LEFT KNEE  Plan: Plan for Procedure(s): ARTHROSCOPIC PARTIAL LATERAL MENISCECTOMY, LEFT KNEE. Patient's failed non-operative management. She continues her mechanical symptoms and pain in the lateral left knee. This affects her ability to perform at work. She has decided to proceed with a left knee arthroscopy with partial lateral meniscectomy.  I discussed the risks and benefits of surgery. The risks include but are not limited to infection, bleeding  requiring blood transfusion, nerve or blood vessel injury, joint stiffness or loss of motion, persistent pain or mechanical symptoms and the need for further surgery. Medical risks include but are not limited to DVT and pulmonary embolism, myocardial infarction, stroke, pneumonia, respiratory failure and death. Patient understood these risks and wished to proceed.   Juanell Fairly, MD   03/08/2015 2:17 PM

## 2015-03-08 NOTE — Op Note (Signed)
03/08/2015  5:00 PM  PATIENT:  Katie Lambert    PRE-OPERATIVE DIAGNOSIS:  TEAR OF LATERAL MENISCUS, LEFT KNEE  POST-OPERATIVE DIAGNOSIS: Bucket-handle tear of the lateral meniscus, left knee with grade 4 chondral injury of the femoral trochlea and grade 3 focal chondral lesions in the medial and lateral femoral condyles.  There was grade 1 chondral softening of the undersurface of patella.  There was diffuse fraying of the cartilage surface of the lateral tibial plateau.  PROCEDURE:  Left knee arthroscopic lateral meniscus repair, chondroplasty of the femoral trochlea and medial and lateral femoral condyles.  SURGEON:  Thornton Park, MD  ANESTHESIA:   General  PREOPERATIVE INDICATIONS:  Katie Lambert is a  34 y.o. female with a diagnosis of TEAR OF LATERAL MENISCUS, LEFT KNEE who failed conservative management and elected for surgical management.    OPERATIVE IMPLANTS: [employer] FasT-Fix 360 degree Needle Delivery System x 5 curved and x 2 straight  OPERATIVE FINDINGS: Bucket-handle tear of the lateral meniscus, left knee with grade 4 chondral injury of the femoral trochlea and grade 3 focal chondral lesions in the medial and lateral femoral condyles.  There was grade 1 chondral softening of the undersurface of patella.  There was diffuse fraying of the cartilage surface of the lateral tibial plateau.  OPERATIVE PROCEDURE: Patient was met in the preoperative area. The operative extremity was signed with the word yes and my initials according the hospital's correct site of surgery protocol.    I discussed the risks and benefits of surgery with the patient prior to surgery. She was aware that the risks included but are not limited to infection, bleeding, nerve or blood vessel injury, joint stiffness or loss of motion, persistent pain and mechanical symptoms and need for further surgery. Medical risks include but are not limited to DVT and pulmonary embolism, myocardial infarction,  stroke, pneumonia, respiratory failure and death. Patient understood these risks and wished to proceed.   The patient was brought to the operating room where she was placed supine on the operative table. General anesthesia was administered. The patient was prepped and draped in a sterile fashion.  A timeout was performed to verify the patient's name, date of birth, medical record number, correct site of surgery correct procedure to be performed. It was also used to verify the patient had had received antibiotics that all appropriate instruments, and radiographic studies were available in the room. Once all in attendance were in agreement, the case began.  Examination under anesthesia revealed knee range of motion from 0-120. There is a firm endpoint on Lachman's test and no anterior laxity to Lachman's tests are anterior drawer testing. She had a negative pivot shift.   Proposed arthroscopy incisions were drawn out with a surgical marker. These were pre-injected with 1% lidocaine plain. An 11 blade was used to establish an inferior lateral and inferomedial portals. The inferomedial portal was created using a 18-gauge spinal needle under direct visualization.  A full diagnostic examination of the knee was performed including the suprapatellar pouch, patellofemoral joint, medial lateral compartments as well as the medial lateral gutters, the intercondylar notch in the posterior knee.  Patient was found to have focal grade 3 chondral lesions involving the medial and lateral femoral condyles. Chondroplasties were performed of both condyles using a 3-5 resector shaver blade. There is no evidence of a medial meniscus tear. The anterior cruciate ligament was intact but showed mild laxity to probing consistent with possible partial sprain.  Patient had  a large bucket-handle meniscus tear. This tear was a peripheral tear. The posterior horn and body of the lateral meniscus were easily displaced into the lateral  compartment. Given the large peripheral tear in the patient's young age and high activity level at baseline the decision was made to repair the meniscus. I spoke with the patient's husband by phone in the OR to explain to him the change in operative plan. I explained to him that removal of such a large portion of her meniscus would likely lead to rapid onset advanced arthritis in the lateral compartment.   He understood that it would require 6 weeks of protected weightbearing on crutches following meniscus repair and there was a chance the meniscus would not heal and she would require a secondary surgery for meniscal debridement.   He was in agreement with the plan for repair versus removal.  To repair the meniscus, the patient had her knee placed in a figure 4 position. The lateral meniscus was repaired using [employer] Fast Fix 360 meniscal repair system.  With the arthroscope in the lateral portal four FasT-Fix 360 curved needles were used to place four meniscal anchors. These were done under direct visualization. These 4 needles were placed into the meniscus in the body and femoral articular side of the posterior horn. Once the meniscus had been secured with the initial 4 anchors the arthroscope was placed to the medial portal and 2 straight and 1 additional curved anchor were placed into the undersurface of posterior horn and body of the meniscus to obtain adequate reduction and stabilization. Once all meniscus anchors were placed the meniscus was probed and found to have excellent stability. Final arthroscopic images of the meniscus repair were taken.  The arthroscope was then placed back into the lateral portal. The knee was taken out of the figure 4 position. The knee was then extended. The patellofemoral joint was then visualized.   A 3-5 resector shaver blade was placed through the medial portal and a chondroplasty of the femoral trochlea was performed. The arthroscope was then placed through the  medial portal and the shaver was placed through the lateral portal to complete the chondroplasty of the trochlea to remove all loose cartilage from the margins of the lesion.. The grade 4 trochlear lesion measured approximately a 2 x 2 cm.  No debridement was performed of the undersurface of the patella.  The knee was then copiously lavaged. All arthroscopic incisions removed. The 2 arthroscopy portals were closed with 4-0 nylon. Steri-Strips were applied along with a dry sterile and compressive dressing. 30 cc of 0.25% Marcaine plain was then injected intra-articularly to help with postop pain control. The patient was brought to the PACU in stable condition. I scrubbed and present the entire case and all sharp and instrument counts were correct at the conclusion the case. I spoke to the patient's family in the postop consultation room to let them know the case it done without complication and the patient was stable in the recovery room.  I reviewed the findings of arthritis and the large meniscus tear. I explained the details of the meniscus repair and the associated change in postoperative recovery. Patient was placed in a hinged knee brace allowing for 0-90 of flexion. She will remain protected weightbearing on crutches for a total of 6 weeks postop.   Timoteo Gaul, MD

## 2015-03-08 NOTE — Discharge Instructions (Addendum)
Patient should ice and elevate the left lower extremity for a minimum of 3 days. She needs to use crutches or a walker with touchdown weightbearing only on the left lower extremity.  She should avoid prolonged periods of standing or walking to avoid swelling in the left knee. Patient should keep the dressing clean dry and intact for 72 hours. After 3 days the patient may remove her dressing and leave on the Steri-Strips. She may use her Polar Care to reduce the swelling. She'll take an enteric-coated aspirin 325 mg daily for DVT prophylaxis x 6 weeks.  Patient should wear her hinged brace whenever standing or ambulating. The brace should remain set to allow for 0-90 of flexion. Weightbearing on the left knee or flexion of the knee beyond 90 may jeopardize the meniscal repair. Patient follow-up in the office in approximately 7 days for wound check and suture removal. She'll begin physical therapy at that time.    General Anesthesia General anesthesia is a sleep-like state of non-feeling produced by medicines (anesthetics). General anesthesia prevents you from being alert and feeling pain during a medical procedure. Your caregiver may recommend general anesthesia if your procedure:  Is long.  Is painful or uncomfortable.  Would be frightening to see or hear.  Requires you to be still.  Affects your breathing.  Causes significant blood loss. LET YOUR CAREGIVER KNOW ABOUT:  Allergies to food or medicine.  Medicines taken, including vitamins, herbs, eyedrops, over-the-counter medicines, and creams.  Use of steroids (by mouth or creams).  Previous problems with anesthetics or numbing medicines, including problems experienced by relatives.  History of bleeding problems or blood clots.  Previous surgeries and types of anesthetics received.  Possibility of pregnancy, if this applies.  Use of cigarettes, alcohol, or illegal drugs.  Any health condition(s), especially diabetes, sleep  apnea, and high blood pressure. RISKS AND COMPLICATIONS General anesthesia rarely causes complications. However, if complications do occur, they can be life threatening. Complications include:  A lung infection.  A stroke.  A heart attack.  Waking up during the procedure. When this occurs, the patient may be unable to move and communicate that he or she is awake. The patient may feel severe pain. Older adults and adults with serious medical problems are more likely to have complications than adults who are young and healthy. Some complications can be prevented by answering all of your caregiver's questions thoroughly and by following all pre-procedure instructions. It is important to tell your caregiver if any of the pre-procedure instructions, especially those related to diet, were not followed. Any food or liquid in the stomach can cause problems when you are under general anesthesia. BEFORE THE PROCEDURE  Ask your caregiver if you will have to spend the night at the hospital. If you will not have to spend the night, arrange to have an adult drive you and stay with you for 24 hours.  Follow your caregiver's instructions if you are taking dietary supplements or medicines. Your caregiver may tell you to stop taking them or to reduce your dosage.  Do not smoke for as long as possible before your procedure. If possible, stop smoking 3-6 weeks before the procedure.  Do not take new dietary supplements or medicines within 1 week of your procedure unless your caregiver approves them.  Do not eat within 8 hours of your procedure or as directed by your caregiver. Drink only clear liquids, such as water, black coffee (without milk or cream), and fruit juices (without pulp).  Do not drink within 3 hours of your procedure or as directed by your caregiver.  You may brush your teeth on the morning of the procedure, but make sure to spit out the toothpaste and water when finished. PROCEDURE  You will  receive anesthetics through a mask, through an intravenous (IV) access tube, or through both. A doctor who specializes in anesthesia (anesthesiologist) or a nurse who specializes in anesthesia (nurse anesthetist) or both will stay with you throughout the procedure to make sure you remain unconscious. He or she will also watch your blood pressure, pulse, and oxygen levels to make sure that the anesthetics do not cause any problems. Once you are asleep, a breathing tube or mask may be used to help you breathe. AFTER THE PROCEDURE You will wake up after the procedure is complete. You may be in the room where the procedure was performed or in a recovery area. You may have a sore throat if a breathing tube was used. You may also feel:  Dizzy.  Weak.  Drowsy.  Confused.  Nauseous.  Cold. These are all normal responses and can be expected to last for up to 24 hours after the procedure is complete. A caregiver will tell you when you are ready to go home. This will usually be when you are fully awake and in stable condition. Document Released: 09/17/2007 Document Revised: 10/25/2013 Document Reviewed: 10/09/2011 [facility] Patient Information 2015 [place], [place]. This information is not intended to replace advice given to you by your health care provider. Make sure you discuss any questions you have with your health care provider.

## 2015-03-08 NOTE — Anesthesia Preprocedure Evaluation (Signed)
Anesthesia Evaluation  Patient identified by MRN, date of birth, ID band Patient awake    Reviewed: Allergy & Precautions, NPO status , Patient's Chart, lab work & pertinent test results  Airway Mallampati: III       Dental no notable dental hx.    Pulmonary Current Smoker,    + rhonchi        Cardiovascular hypertension, Pt. on medications Normal cardiovascular exam     Neuro/Psych    GI/Hepatic Neg liver ROS, GERD  Medicated,  Endo/Other  negative endocrine ROS  Renal/GU negative Renal ROS     Musculoskeletal  (+) Arthritis ,   Abdominal Normal abdominal exam  (+)   Peds  Hematology negative hematology ROS (+)   Anesthesia Other Findings   Reproductive/Obstetrics                             Anesthesia Physical Anesthesia Plan  ASA: II  Anesthesia Plan: General   Post-op Pain Management:    Induction: Intravenous  Airway Management Planned: LMA  Additional Equipment:   Intra-op Plan:   Post-operative Plan: Extubation in OR  Informed Consent: I have reviewed the patients History and Physical, chart, labs and discussed the procedure including the risks, benefits and alternatives for the proposed anesthesia with the patient or authorized representative who has indicated his/her understanding and acceptance.     Plan Discussed with: CRNA  Anesthesia Plan Comments:         Anesthesia Quick Evaluation

## 2015-03-09 ENCOUNTER — Encounter: Payer: Self-pay | Admitting: Orthopedic Surgery

## 2015-03-13 ENCOUNTER — Encounter: Payer: Self-pay | Admitting: Orthopedic Surgery

## 2015-03-14 NOTE — Anesthesia Postprocedure Evaluation (Signed)
  Anesthesia Post-op Note  Patient: Katie Lambert  Procedure(s) Performed: Procedure(s): ARTHROSCOPY KNEE (Left)  Anesthesia type:General  Patient location: PACU  Post pain: Pain level controlled  Post assessment: Post-op Vital signs reviewed, Patient's Cardiovascular Status Stable, Respiratory Function Stable, Patent Airway and No signs of Nausea or vomiting  Post vital signs: Reviewed and stable  Last Vitals:  Filed Vitals:   03/08/15 1908  BP: 131/75  Pulse: 84  Temp:   Resp: 16    Level of consciousness: awake, alert  and patient cooperative  Complications: No apparent anesthesia complications

## 2015-07-10 ENCOUNTER — Encounter
Admission: RE | Admit: 2015-07-10 | Discharge: 2015-07-10 | Disposition: A | Discharge status: Home / Self Care | Payer: BLUE CROSS/BLUE SHIELD | Source: Ambulatory Visit | Attending: Orthopedic Surgery | Admitting: Orthopedic Surgery

## 2015-07-10 DIAGNOSIS — I10 Essential (primary) hypertension: Secondary | ICD-10-CM | POA: Diagnosis not present

## 2015-07-10 DIAGNOSIS — F17210 Nicotine dependence, cigarettes, uncomplicated: Secondary | ICD-10-CM | POA: Diagnosis not present

## 2015-07-10 DIAGNOSIS — X58XXXA Exposure to other specified factors, initial encounter: Secondary | ICD-10-CM | POA: Diagnosis not present

## 2015-07-10 DIAGNOSIS — Z818 Family history of other mental and behavioral disorders: Secondary | ICD-10-CM | POA: Diagnosis not present

## 2015-07-10 DIAGNOSIS — Z79899 Other long term (current) drug therapy: Secondary | ICD-10-CM | POA: Diagnosis not present

## 2015-07-10 DIAGNOSIS — S83272A Complex tear of lateral meniscus, current injury, left knee, initial encounter: Secondary | ICD-10-CM | POA: Diagnosis not present

## 2015-07-10 DIAGNOSIS — M1712 Unilateral primary osteoarthritis, left knee: Secondary | ICD-10-CM | POA: Diagnosis present

## 2015-07-10 DIAGNOSIS — I499 Cardiac arrhythmia, unspecified: Secondary | ICD-10-CM | POA: Diagnosis not present

## 2015-07-10 DIAGNOSIS — K219 Gastro-esophageal reflux disease without esophagitis: Secondary | ICD-10-CM | POA: Diagnosis not present

## 2015-07-10 DIAGNOSIS — S83262A Peripheral tear of lateral meniscus, current injury, left knee, initial encounter: Secondary | ICD-10-CM | POA: Diagnosis not present

## 2015-07-10 DIAGNOSIS — Y939 Activity, unspecified: Secondary | ICD-10-CM | POA: Diagnosis not present

## 2015-07-10 DIAGNOSIS — Z8249 Family history of ischemic heart disease and other diseases of the circulatory system: Secondary | ICD-10-CM | POA: Diagnosis not present

## 2015-07-10 DIAGNOSIS — Z90710 Acquired absence of both cervix and uterus: Secondary | ICD-10-CM | POA: Diagnosis not present

## 2015-07-10 LAB — PROTIME-INR
INR: 1.02
Prothrombin Time: 13.6 seconds (ref 11.4–15.0)

## 2015-07-10 LAB — CBC
HEMATOCRIT: 43.2 % (ref 35.0–47.0)
Hemoglobin: 14.4 g/dL (ref 12.0–16.0)
MCH: 30.6 pg (ref 26.0–34.0)
MCHC: 33.4 g/dL (ref 32.0–36.0)
MCV: 91.7 fL (ref 80.0–100.0)
PLATELETS: 218 10*3/uL (ref 150–440)
RBC: 4.71 MIL/uL (ref 3.80–5.20)
RDW: 12.1 % (ref 11.5–14.5)
WBC: 6.6 10*3/uL (ref 3.6–11.0)

## 2015-07-10 LAB — BASIC METABOLIC PANEL
ANION GAP: 7 (ref 5–15)
BUN: 20 mg/dL (ref 6–20)
CHLORIDE: 102 mmol/L (ref 101–111)
CO2: 29 mmol/L (ref 22–32)
Calcium: 9.6 mg/dL (ref 8.9–10.3)
Creatinine, Ser: 0.76 mg/dL (ref 0.44–1.00)
GFR calc Af Amer: >60 mL/min (ref >60)
GLUCOSE: 74 mg/dL (ref 65–99)
POTASSIUM: 4.5 mmol/L (ref 3.5–5.1)
Sodium: 138 mmol/L (ref 135–145)

## 2015-07-10 LAB — APTT: APTT: 29 s (ref 24–36)

## 2015-07-10 NOTE — Patient Instructions (Signed)
  Your procedure is scheduled on: Thursday 07/13/2015 Report to Day Surgery. 2ND FLOOR MEDICAL MALL ENTRANCE To find out your arrival time please call [phone] between 1PM - 3PM on Wednesday 07/12/2015.  Remember: Instructions that are not followed completely may result in serious medical risk, up to and including death, or upon the discretion of your surgeon and anesthesiologist your surgery may need to be rescheduled.    __X__ 1. Do not eat food or drink liquids after midnight. No gum chewing or hard candies.     __X__ 2. No Alcohol for 24 hours before or after surgery.   ____ 3. Bring all medications with you on the day of surgery if instructed.    __X__ 4. Notify your doctor if there is any change in your medical condition     (cold, fever, infections).     Do not wear jewelry, make-up, hairpins, clips or nail polish.  Do not wear lotions, powders, or perfumes. You may wear deodorant.  Do not shave 48 hours prior to surgery. Men may shave face and neck.  Do not bring valuables to the hospital.    [facility] is not responsible for any belongings or valuables.               Contacts, dentures or bridgework may not be worn into surgery.  Leave your suitcase in the car. After surgery it may be brought to your room.  For patients admitted to the hospital, discharge time is determined by your                treatment team.   Patients discharged the day of surgery will not be allowed to drive home.   Please read over the following fact sheets that you were given:   Surgical Site Infection Prevention   __X__ Take these medicines the morning of surgery with A SIP OF WATER:    1. LISINOPRIL  2. LORATIDINE  3.   4.  5.  6.  ____ Fleet Enema (as directed)   __X__ Use CHG Soap as directed  ____ Use inhalers on the day of surgery  ____ Stop metformin 2 days prior to surgery    ____ Take 1/2 of usual insulin dose the night before surgery and none on the morning of surgery.    ____ Stop Coumadin/Plavix/aspirin on   __X__ Stop Anti-inflammatories on TODAY (MELOXICAM)   ____ Stop supplements until after surgery.    ____ Bring C-Pap to the hospital.

## 2015-07-13 ENCOUNTER — Ambulatory Visit: Payer: BLUE CROSS/BLUE SHIELD | Admitting: Anesthesiology

## 2015-07-13 ENCOUNTER — Encounter: Payer: Self-pay | Admitting: *Deleted

## 2015-07-13 ENCOUNTER — Ambulatory Visit
Admission: RE | Admit: 2015-07-13 | Discharge: 2015-07-13 | Disposition: A | Discharge status: Home / Self Care | Payer: BLUE CROSS/BLUE SHIELD | Source: Ambulatory Visit | Attending: Orthopedic Surgery | Admitting: Orthopedic Surgery

## 2015-07-13 ENCOUNTER — Encounter
Admission: RE | Disposition: A | Discharge status: Home / Self Care | Payer: Self-pay | Source: Ambulatory Visit | Attending: Orthopedic Surgery

## 2015-07-13 DIAGNOSIS — M1712 Unilateral primary osteoarthritis, left knee: Secondary | ICD-10-CM | POA: Insufficient documentation

## 2015-07-13 DIAGNOSIS — I10 Essential (primary) hypertension: Secondary | ICD-10-CM | POA: Insufficient documentation

## 2015-07-13 DIAGNOSIS — S83262A Peripheral tear of lateral meniscus, current injury, left knee, initial encounter: Secondary | ICD-10-CM | POA: Insufficient documentation

## 2015-07-13 DIAGNOSIS — Z79899 Other long term (current) drug therapy: Secondary | ICD-10-CM | POA: Insufficient documentation

## 2015-07-13 DIAGNOSIS — Z90710 Acquired absence of both cervix and uterus: Secondary | ICD-10-CM | POA: Insufficient documentation

## 2015-07-13 DIAGNOSIS — S83272A Complex tear of lateral meniscus, current injury, left knee, initial encounter: Secondary | ICD-10-CM | POA: Insufficient documentation

## 2015-07-13 DIAGNOSIS — I499 Cardiac arrhythmia, unspecified: Secondary | ICD-10-CM | POA: Insufficient documentation

## 2015-07-13 DIAGNOSIS — Y939 Activity, unspecified: Secondary | ICD-10-CM | POA: Insufficient documentation

## 2015-07-13 DIAGNOSIS — X58XXXA Exposure to other specified factors, initial encounter: Secondary | ICD-10-CM | POA: Insufficient documentation

## 2015-07-13 DIAGNOSIS — F17210 Nicotine dependence, cigarettes, uncomplicated: Secondary | ICD-10-CM | POA: Insufficient documentation

## 2015-07-13 DIAGNOSIS — Z8249 Family history of ischemic heart disease and other diseases of the circulatory system: Secondary | ICD-10-CM | POA: Insufficient documentation

## 2015-07-13 DIAGNOSIS — K219 Gastro-esophageal reflux disease without esophagitis: Secondary | ICD-10-CM | POA: Insufficient documentation

## 2015-07-13 DIAGNOSIS — Z818 Family history of other mental and behavioral disorders: Secondary | ICD-10-CM | POA: Insufficient documentation

## 2015-07-13 HISTORY — PX: KNEE ARTHROSCOPY: SHX127

## 2015-07-13 LAB — URINALYSIS COMPLETE WITH MICROSCOPIC (ARMC ONLY)
Bacteria, UA: NONE SEEN
Bilirubin Urine: NEGATIVE
Glucose, UA: NEGATIVE mg/dL
HGB URINE DIPSTICK: NEGATIVE
KETONES UR: NEGATIVE mg/dL
LEUKOCYTES UA: NEGATIVE
Nitrite: NEGATIVE
PH: 5 (ref 5.0–8.0)
PROTEIN: NEGATIVE mg/dL
Specific Gravity, Urine: 1.02 (ref 1.005–1.030)

## 2015-07-13 SURGERY — ARTHROSCOPY, KNEE
Anesthesia: General | Site: Knee | Laterality: Left | Wound class: Clean

## 2015-07-13 MED ORDER — PROPOFOL 10 MG/ML IV BOLUS
INTRAVENOUS | Status: DC | PRN
  Administered 2015-07-13: 200 mg via INTRAVENOUS

## 2015-07-13 MED ORDER — BUPIVACAINE-EPINEPHRINE (PF) 0.25% -1:200000 IJ SOLN
INTRAMUSCULAR | Status: AC
  Filled 2015-07-13: qty 30

## 2015-07-13 MED ORDER — DIPHENHYDRAMINE HCL 50 MG/ML IJ SOLN
INTRAMUSCULAR | Status: DC | PRN
  Administered 2015-07-13: 25 mg via INTRAVENOUS

## 2015-07-13 MED ORDER — FENTANYL CITRATE (PF) 100 MCG/2ML IJ SOLN
25.0000 ug | INTRAMUSCULAR | Status: DC | PRN
  Administered 2015-07-13 (×4): 25 ug via INTRAVENOUS

## 2015-07-13 MED ORDER — FENTANYL CITRATE (PF) 100 MCG/2ML IJ SOLN
INTRAMUSCULAR | Status: AC
  Administered 2015-07-13: 25 ug via INTRAVENOUS
  Filled 2015-07-13: qty 2

## 2015-07-13 MED ORDER — FAMOTIDINE 20 MG PO TABS
ORAL_TABLET | ORAL | Status: AC
  Filled 2015-07-13: qty 1

## 2015-07-13 MED ORDER — LIDOCAINE HCL 1 % IJ SOLN
INTRAMUSCULAR | Status: DC | PRN
  Administered 2015-07-13: 15 mL

## 2015-07-13 MED ORDER — BUPIVACAINE-EPINEPHRINE 0.25% -1:200000 IJ SOLN
INTRAMUSCULAR | Status: DC | PRN
  Administered 2015-07-13: 30 mL

## 2015-07-13 MED ORDER — MIDAZOLAM HCL 2 MG/2ML IJ SOLN
INTRAMUSCULAR | Status: DC | PRN
  Administered 2015-07-13: 2 mg via INTRAVENOUS

## 2015-07-13 MED ORDER — LIDOCAINE HCL (PF) 1 % IJ SOLN
INTRAMUSCULAR | Status: AC
Start: 2015-07-13 — End: 2015-07-13
  Filled 2015-07-13: qty 30

## 2015-07-13 MED ORDER — FENTANYL CITRATE (PF) 100 MCG/2ML IJ SOLN
INTRAMUSCULAR | Status: DC | PRN
  Administered 2015-07-13 (×2): 50 ug via INTRAVENOUS

## 2015-07-13 MED ORDER — GLYCOPYRROLATE 0.2 MG/ML IJ SOLN
INTRAMUSCULAR | Status: DC | PRN
  Administered 2015-07-13: 0.2 mg via INTRAVENOUS

## 2015-07-13 MED ORDER — LACTATED RINGERS IV SOLN
INTRAVENOUS | Status: DC
  Administered 2015-07-13: 10:00:00 via INTRAVENOUS

## 2015-07-13 MED ORDER — ASPIRIN EC 325 MG PO TBEC: 325.0000 mg | DELAYED_RELEASE_TABLET | Freq: Every day | ORAL | Status: DC

## 2015-07-13 MED ORDER — CEFAZOLIN SODIUM-DEXTROSE 2-3 GM-% IV SOLR
2.0000 g | Freq: Once | INTRAVENOUS | Status: AC
  Administered 2015-07-13: 2 g via INTRAVENOUS

## 2015-07-13 MED ORDER — LIDOCAINE HCL (CARDIAC) 20 MG/ML IV SOLN
INTRAVENOUS | Status: DC | PRN
  Administered 2015-07-13: 50 mg via INTRAVENOUS

## 2015-07-13 MED ORDER — ONDANSETRON HCL 4 MG/2ML IJ SOLN
INTRAMUSCULAR | Status: DC | PRN
Start: 2015-07-13 — End: 2015-07-13
  Administered 2015-07-13: 4 mg via INTRAVENOUS

## 2015-07-13 MED ORDER — ONDANSETRON HCL 4 MG/2ML IJ SOLN: 4.0000 mg | Freq: Once | INTRAMUSCULAR | Status: DC | PRN

## 2015-07-13 MED ORDER — OXYCODONE HCL 5 MG PO TABS: 5.0000 mg | ORAL_TABLET | ORAL | Status: DC | PRN

## 2015-07-13 MED ORDER — FAMOTIDINE 20 MG PO TABS
20.0000 mg | ORAL_TABLET | Freq: Once | ORAL | Status: AC
  Administered 2015-07-13: 20 mg via ORAL

## 2015-07-13 MED ORDER — EPHEDRINE SULFATE 50 MG/ML IJ SOLN
INTRAMUSCULAR | Status: DC | PRN
  Administered 2015-07-13: 10 mg via INTRAVENOUS

## 2015-07-13 MED ORDER — CEFAZOLIN SODIUM-DEXTROSE 2-3 GM-% IV SOLR
INTRAVENOUS | Status: AC
  Filled 2015-07-13: qty 50

## 2015-07-13 SURGICAL SUPPLY — 32 items
BUR RADIUS 3.5 (BURR) ×3 IMPLANT
BUR RADIUS 4.0X18.5 (BURR) ×3 IMPLANT
CLOSURE WOUND 1/2 X4 (GAUZE/BANDAGES/DRESSINGS) ×1
COOLER POLAR GLACIER W/PUMP (MISCELLANEOUS) ×3 IMPLANT
DRAPE IMP U-DRAPE 54X76 (DRAPES) ×3 IMPLANT
DURAPREP 26ML APPLICATOR (WOUND CARE) ×9 IMPLANT
GAUZE PETRO XEROFOAM 1X8 (MISCELLANEOUS) ×3 IMPLANT
GAUZE SPONGE 4X4 12PLY STRL (GAUZE/BANDAGES/DRESSINGS) ×3 IMPLANT
GLOVE BIOGEL PI IND STRL 9 (GLOVE) ×1 IMPLANT
GLOVE BIOGEL PI INDICATOR 9 (GLOVE) ×2
GLOVE SURG 9.0 ORTHO LTXF (GLOVE) ×3 IMPLANT
GOWN STRL REUS W/ TWL LRG LVL3 (GOWN DISPOSABLE) ×1 IMPLANT
GOWN STRL REUS W/TWL 2XL LVL3 (GOWN DISPOSABLE) ×3 IMPLANT
GOWN STRL REUS W/TWL LRG LVL3 (GOWN DISPOSABLE) ×2
IV LACTATED RINGER IRRG 3000ML (IV SOLUTION) ×12
IV LR IRRIG 3000ML ARTHROMATIC (IV SOLUTION) ×6 IMPLANT
KIT RM TURNOVER STRD PROC AR (KITS) ×3 IMPLANT
MANIFOLD NEPTUNE II (INSTRUMENTS) ×3 IMPLANT
PACK ARTHROSCOPY KNEE (MISCELLANEOUS) ×3 IMPLANT
PAD ABD DERMACEA PRESS 5X9 (GAUZE/BANDAGES/DRESSINGS) IMPLANT
PAD WRAPON POLAR KNEE (MISCELLANEOUS) ×1 IMPLANT
SET TUBE SUCT SHAVER OUTFL 24K (TUBING) ×3 IMPLANT
SOL PREP PVP 2OZ (MISCELLANEOUS) ×3
SOLUTION PREP PVP 2OZ (MISCELLANEOUS) ×1 IMPLANT
STRIP CLOSURE SKIN 1/2X4 (GAUZE/BANDAGES/DRESSINGS) ×2 IMPLANT
SUT ETHILON 4-0 (SUTURE) ×2
SUT ETHILON 4-0 FS2 18XMFL BLK (SUTURE) ×1
SUTURE ETHLN 4-0 FS2 18XMF BLK (SUTURE) ×1 IMPLANT
TUBING ARTHRO INFLOW-ONLY STRL (TUBING) ×3 IMPLANT
WAND HAND CNTRL MULTIVAC 50 (MISCELLANEOUS) IMPLANT
WAND HAND CNTRL MULTIVAC 90 (MISCELLANEOUS) ×3 IMPLANT
WRAPON POLAR PAD KNEE (MISCELLANEOUS) ×3

## 2015-07-13 NOTE — Progress Notes (Signed)
Ancef 2 gm sent to OR with patient 

## 2015-07-13 NOTE — Op Note (Signed)
  PATIENT:  Katie Lambert  PRE-OPERATIVE DIAGNOSIS:  TEAR OF LATERAL MENISCUS AND LATERAL COMPARTMENT OSTEOARTHRITIS  POST-OPERATIVE DIAGNOSIS:  Same  PROCEDURE:  LEFT KNEE ARTHROSCOPY WITH  Partial LATERAL MENISECTOMY, chondroplasty of the lateral femoral condyle.  SURGEON:  Thornton Park, MD  ANESTHESIA:   General  PREOPERATIVE INDICATIONS:  Katie Lambert  35 y.o. female persistent left lateral knee pain after repair of the lateral meniscus tear with a suspected diagnosis of failed LATERAL MENISCUS repair who failed conservative management and elected for surgical management.    The risks benefits and alternatives were discussed with the patient preoperatively including the risks of infection, bleeding, nerve injury, knee stiffness, persistent pain, osteoarthritis and the need for further surgery. Medical  risks include DVT and pulmonary embolism, myocardial infarction, stroke, pneumonia, respiratory failure and death. The patient understood these risks and wished to proceed.   OPERATIVE FINDINGS: Grade IV chondral lesion of the lateral femoral condyle and failed lateral meniscus repair  OPERATIVE PROCEDURE: Patient was met in the preoperative area. The operative extremity was signed with the word yes and my initials according the hospital's correct site of surgery protocol.  The patient was brought to the operating room where they was placed supine on the operative table. General anesthesia was administered. The patient was prepped and draped in a sterile fashion.  A timeout was performed to verify the patient's name, date of birth, medical record number, correct site of surgery correct procedure to be performed. It was also used to verify the patient had had received antibiotics that all appropriate instruments, and radiographic studies were available in the room. Once all in attendance were in agreement, the case began.  The previous arthroscopy incisions were pre-injected  with 1% lidocaine plain. An 11 blade was used to establish an inferior lateral and inferomedial portals. The inferomedial portal was created using a 18-gauge spinal needle under direct visualization.  A full diagnostic examination of the knee was performed including the suprapatellar pouch, patellofemoral joint, medial lateral compartments as well as the medial lateral gutters, the intercondylar notch in the posterior knee.  Findings on arthroscopy included Grade IV chondral lesion of the lateral femoral condyle and failed lateral meniscus repair.  Patient had the meniscal tear treated with a 4-0 resector shaver blade and straight duckbill basket. The meniscus was debrided until a stable rim was achieved. The majority of the body of the lateral meniscus was removed as the tear was a complex peripheral tear.  A chondroplasty of the lateral femoral condyle, was also performed using a 4-0 resector shaver blade. A partial synovectomy was also performed using a 4-0 resector shaver blade.  The knee was then copiously lavaged. All arthroscopic incisions removed. The 2 arthroscopy portals were closed with 4-0 nylon. Steri-Strips were applied along with a dry sterile and compressive dressing. The patient was brought to the PACU in stable condition. I scrubbed and present the entire case and all sharp and instrument counts were correct at the conclusion the case. I spoke to the patient's family memberpostoperatively to let them know the case was completed without complication and the patient was stable in the recovery room.  Timoteo Gaul, MD

## 2015-07-13 NOTE — Anesthesia Preprocedure Evaluation (Signed)
Anesthesia Evaluation  Patient identified by MRN, date of birth, ID band Patient awake    Reviewed: Allergy & Precautions, NPO status , Patient's Chart, lab work & pertinent test results  Airway Mallampati: II  TM Distance: >3 FB Neck ROM: Full    Dental no notable dental hx.    Pulmonary Current Smoker,    Pulmonary exam normal breath sounds clear to auscultation       Cardiovascular hypertension, Pt. on medications Normal cardiovascular exam     Neuro/Psych negative neurological ROS  negative psych ROS   GI/Hepatic Neg liver ROS, GERD  Medicated and Controlled,  Endo/Other  negative endocrine ROS  Renal/GU negative Renal ROS  negative genitourinary   Musculoskeletal  (+) Arthritis , Osteoarthritis,    Abdominal Normal abdominal exam  (+)   Peds negative pediatric ROS (+)  Hematology negative hematology ROS (+)   Anesthesia Other Findings   Reproductive/Obstetrics                             Anesthesia Physical Anesthesia Plan  ASA: II  Anesthesia Plan: General   Post-op Pain Management:    Induction: Intravenous  Airway Management Planned: LMA  Additional Equipment:   Intra-op Plan:   Post-operative Plan: Extubation in OR  Informed Consent: I have reviewed the patients History and Physical, chart, labs and discussed the procedure including the risks, benefits and alternatives for the proposed anesthesia with the patient or authorized representative who has indicated his/her understanding and acceptance.   Dental advisory given  Plan Discussed with: CRNA and Surgeon  Anesthesia Plan Comments:         Anesthesia Quick Evaluation

## 2015-07-13 NOTE — Anesthesia Procedure Notes (Signed)
Procedure Name: LMA Insertion Performed by: Findley Vi Pre-anesthesia Checklist: Patient identified, Patient being monitored, Timeout performed, Emergency Drugs available and Suction available Patient Re-evaluated:Patient Re-evaluated prior to inductionOxygen Delivery Method: Circle system utilized Preoxygenation: Pre-oxygenation with 100% oxygen Intubation Type: IV induction Ventilation: Mask ventilation without difficulty LMA: LMA inserted LMA Size: 3.0 Tube type: Oral Number of attempts: 1 Placement Confirmation: positive ETCO2 and breath sounds checked- equal and bilateral Tube secured with: Tape Dental Injury: Teeth and Oropharynx as per pre-operative assessment      

## 2015-07-13 NOTE — H&P (Signed)
PREOPERATIVE H&P  Chief Complaint: UNILATERAL PRIMARY OSTEOARTHRITIS LEFT KNEE  HPI: Katie Lambert is a 35 y.o. female who presents for preoperative history and physical with a diagnosis of UNILATERAL PRIMARY OSTEOARTHRITIS AND MENISCUS TEAR LEFT KNEE. Symptoms are rated as moderate to severe, and have been worsening.  This is significantly impairing activities of daily living.  She has elected for surgical management.   Past Medical History  Diagnosis Date  . HTN (hypertension)   . Acid reflux   . Dysrhythmia   . Arthritis    Past Surgical History  Procedure Laterality Date  . Abdominal hysterectomy    . Knee surgery Left     x 2  . Knee arthroscopy Left 03/08/2015    Procedure: ARTHROSCOPY KNEE;  Surgeon: Juanell Fairly, MD;  Location: [facility] ORS;  Service: Orthopedics;  Laterality: Left;  . Tubal ligation     Social History   Social History  . Marital Status: Divorced    Spouse Name: N/A  . Number of Children: 2  . Years of Education: N/A   Occupational History  .     Social History Main Topics  . Smoking status: Current Every Day Smoker -- 0.50 packs/day for 15 years    Types: Cigarettes  . Smokeless tobacco: Never Used     Comment: Currently electronic cigarettes  . Alcohol Use: Yes     Comment: social  . Drug Use: No  . Sexual Activity: Not Asked   Other Topics Concern  . None   Social History Narrative   Works two jobs, 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   Family History  Problem Relation Age of Onset  . Hypertension Father   . Depression Mother   . Hypertension Mother   . Coronary artery disease Maternal Grandfather 50   No Known Allergies Prior to Admission medications   Medication Sig Start Date End Date Taking? Authorizing Provider  amLODipine (NORVASC) 10 MG tablet Take 1 tablet (10 mg total) by mouth daily. Patient taking differently: Take 10 mg by mouth at bedtime.  04/07/12  Yes Rollene Rotunda, MD  hydrochlorothiazide  (MICROZIDE) 12.5 MG capsule Take 12.5 mg by mouth daily.   Yes Historical Provider, MD  lisinopril (PRINIVIL,ZESTRIL) 20 MG tablet Take 20 mg by mouth daily.   Yes Historical Provider, MD  loratadine (CLARITIN) 10 MG tablet Take 10 mg by mouth daily.   Yes Historical Provider, MD  meloxicam (MOBIC) 7.5 MG tablet Take 7.5 mg by mouth daily.    Historical Provider, MD     Positive ROS: All other systems have been reviewed and were otherwise negative with the exception of those mentioned in the HPI and as above.  Physical Exam: General: Alert, no acute distress Cardiovascular: Regular rate and rhythm, no murmurs rubs or gallops.  No pedal edema Respiratory: Clear to auscultation bilaterally, no wheezes rales or rhonchi. No cyanosis, no use of accessory musculature GI: No organomegaly, abdomen is soft and non-tender nondistended with positive bowel sounds. Skin: Skin intact, no lesions within the operative field. Neurologic: Sensation intact distally Psychiatric: Patient is competent for consent with normal mood and affect Lymphatic: No axillary or cervical lymphadenopathy  MUSCULOSKELETAL: Left knee:  Patient has a trace knee effusion. There is no erythema or ecchymosis. There is no ligamentous instability. She has point tenderness over the lateral joint line and pain laterally with McMurray's testing. She has 5 out of 5 strength in all muscle groups, intact sensation light touch in palpable pedal  pulses. There is no calf tenderness or lower leg edema. She has patella femoral crepitus but no grind or apprehension.  Assessment: UNILATERAL PRIMARY OSTEOARTHRITIS LEFT KNEE WITH POSSIBLE LATERAL MENISCUS TEAR  Plan: Plan for Procedure(s): ARTHROSCOPY KNEE  Patient has previously undergone a lateral meniscus repair. She continues to have mechanical symptoms and pain suggesting failure of the repair. The plan is to proceed with a diagnostic arthroscopy with possible partial lateral meniscectomy. I  discussed this operation in detail with the patient as well as the postoperative recovery.  I discussed the risks and benefits of surgery. The risks include but are not limited to infection, bleeding requiring blood transfusion, nerve or blood vessel injury, joint stiffness or loss of motion, persistent pain,weakness or instability, and osteoarthritis and the need for further surgery. Medical risks include but are not limited to DVT and pulmonary embolism, myocardial infarction, stroke, pneumonia, respiratory failure and death. Patient understood these risks and wished to proceed.   Juanell Fairly, MD   07/13/2015 11:07 AM

## 2015-07-13 NOTE — Transfer of Care (Signed)
Immediate Anesthesia Transfer of Care Note  Patient: Katie Lambert  Procedure(s) Performed: Procedure(s): ARTHROSCOPY KNEE, partial lateral menisetomy, chondroplasty lateral femoral chondyl (Left)  Patient Location: PACU  Anesthesia Type:General  Level of Consciousness: awake, oriented and patient cooperative  Airway & Oxygen Therapy: Patient Spontanous Breathing and Patient connected to face mask oxygen  Post-op Assessment: Report given to RN and Post -op Vital signs reviewed and stable  Post vital signs: Reviewed and stable  Last Vitals:  Filed Vitals:   07/13/15 1009  BP: 133/86  Pulse: 95  Temp: 37 C  Resp: 16    Complications: No apparent anesthesia complications

## 2015-07-14 NOTE — Discharge Instructions (Signed)
Arthroscopy, With Meniscus Injury, Care After Refer to this sheet in the next few weeks. These instructions provide you with general information on caring for yourself after your procedure. Your health care provider may also give you specific instructions. Your treatment has been planned according to the current medical practices, but problems sometimes occur. Call your health care provider if you have any problems or questions after your procedure. WHAT TO EXPECT AFTER THE PROCEDURE After your procedure, it is typical to have the following:  Pain and swelling in your knee.  Constipation.  Difficulty walking. HOME CARE INSTRUCTIONS   Use crutches and do knee exercises as directed by your health care provider.  Apply ice to the injured area:  Put ice in a plastic bag.  Place a towel between your skin and the bag.  Leave the ice on for 15-20 minutes, 3-4 times a day while awake. Do this for the first 2 days.  Rest and raise (elevate) your knee.  Change bandages (dressings) as directed by your health care provider.  Keep the wound dry and clean. The wound may be washed gently with soap and water. Gently blot or dab the wound dry. It is okay to take showers 24-48 hours after surgery. Do not take baths, use swimming pools, or use hot tubs for 14 days, or as directed by your health care provider.  Only take over-the-counter or prescription medicines for pain, discomfort, or fever as directed by your health care provider.  Continue your normal diet as directed by your health care provider.  Do not lift anything more than 10 pounds or play contact sports for 3 weeks, or as directed by your health care provider.  If a brace was applied, use as directed by your health care provider.  Your health care provider will help with instructions for rehabilitation of your knee. SEEK MEDICAL CARE IF:   You have increased bleeding (more than a small spot) from the wound.  You have redness,  swelling, or increasing pain in the wound.  Yellowish-white fluid (pus) is coming from your wound. SEEK IMMEDIATE MEDICAL CARE IF:   You develop a rash.  You have a fever or persistent symptoms for more than 2-3 days.  You have difficulty breathing.  You have increasing pain with movement of the knee. MAKE SURE YOU:   Understand these instructions.  Will watch your condition.  Will get help right away if you are not doing well or get worse.   This information is not intended to replace advice given to you by your health care provider. Make sure you discuss any questions you have with your health care provider.   Document Released: 12/28/2004 Document Revised: 02/10/2013 Document Reviewed: 11/17/2012 Elsevier Interactive Patient Education 2016 [employer]   General Anesthesia, Adult, Care After Refer to this sheet in the next few weeks. These instructions provide you with information on caring for yourself after your procedure. Your health care provider may also give you more specific instructions. Your treatment has been planned according to current medical practices, but problems sometimes occur. Call your health care provider if you have any problems or questions after your procedure. WHAT TO EXPECT AFTER THE PROCEDURE After the procedure, it is typical to experience:  Sleepiness.  Nausea and vomiting. HOME CARE INSTRUCTIONS  For the first 24 hours after general anesthesia:  Have a responsible person with you.  Do not drive a car. If you are alone, do not take public transportation.  Do not drink alcohol.  Do not take medicine that has not been prescribed by your health care provider.  Do not sign important papers or make important decisions.  You may resume a normal diet and activities as directed by your health care provider.  Change bandages (dressings) as directed.  If you have questions or problems that seem related to general anesthesia, call the  hospital and ask for the anesthetist or anesthesiologist on call. SEEK MEDICAL CARE IF:  You have nausea and vomiting that continue the day after anesthesia.  You develop a rash. SEEK IMMEDIATE MEDICAL CARE IF:   You have difficulty breathing.  You have chest pain.  You have any allergic problems.   This information is not intended to replace advice given to you by your health care provider. Make sure you discuss any questions you have with your health care provider.   Document Released: 09/16/2000 Document Revised: 07/01/2014 Document Reviewed: 10/09/2011 Elsevier Interactive Patient Education [employer]

## 2015-07-14 NOTE — Anesthesia Postprocedure Evaluation (Signed)
Anesthesia Post Note  Patient: Katie Lambert  Procedure(s) Performed: Procedure(s) (LRB): ARTHROSCOPY KNEE, partial lateral menisetomy, chondroplasty lateral femoral chondyl (Left)  Patient location during evaluation: PACU Level of consciousness: awake and alert and oriented Pain management: pain level controlled Vital Signs Assessment: post-procedure vital signs reviewed and stable Respiratory status: spontaneous breathing Cardiovascular status: blood pressure returned to baseline Anesthetic complications: no    Last Vitals:  Filed Vitals:   07/13/15 1350 07/13/15 1408  BP: 117/73 105/63  Pulse: 75 61  Temp:    Resp: 14 14    Last Pain:  Filed Vitals:   07/14/15 0821  PainSc: 5                  [name]

## 2015-10-27 ENCOUNTER — Emergency Department: Payer: BLUE CROSS/BLUE SHIELD

## 2015-10-27 ENCOUNTER — Emergency Department
Admission: EM | Admit: 2015-10-27 | Discharge: 2015-10-27 | Disposition: A | Discharge status: Home / Self Care | Payer: BLUE CROSS/BLUE SHIELD | Attending: Emergency Medicine | Admitting: Emergency Medicine

## 2015-10-27 DIAGNOSIS — Z79899 Other long term (current) drug therapy: Secondary | ICD-10-CM | POA: Insufficient documentation

## 2015-10-27 DIAGNOSIS — Z7982 Long term (current) use of aspirin: Secondary | ICD-10-CM | POA: Insufficient documentation

## 2015-10-27 DIAGNOSIS — F17210 Nicotine dependence, cigarettes, uncomplicated: Secondary | ICD-10-CM | POA: Insufficient documentation

## 2015-10-27 DIAGNOSIS — I10 Essential (primary) hypertension: Secondary | ICD-10-CM | POA: Insufficient documentation

## 2015-10-27 DIAGNOSIS — R1030 Lower abdominal pain, unspecified: Secondary | ICD-10-CM | POA: Insufficient documentation

## 2015-10-27 LAB — COMPREHENSIVE METABOLIC PANEL
ALK PHOS: 65 U/L (ref 38–126)
ALT: 19 U/L (ref 14–54)
AST: 20 U/L (ref 15–41)
Albumin: 4.4 g/dL (ref 3.5–5.0)
Anion gap: 9 (ref 5–15)
BUN: 15 mg/dL (ref 6–20)
CALCIUM: 9.5 mg/dL (ref 8.9–10.3)
CHLORIDE: 104 mmol/L (ref 101–111)
CO2: 26 mmol/L (ref 22–32)
CREATININE: 0.81 mg/dL (ref 0.44–1.00)
Glucose, Bld: 90 mg/dL (ref 65–99)
Potassium: 3.6 mmol/L (ref 3.5–5.1)
SODIUM: 139 mmol/L (ref 135–145)
Total Bilirubin: 0.6 mg/dL (ref 0.3–1.2)
Total Protein: 7 g/dL (ref 6.5–8.1)

## 2015-10-27 LAB — CBC
HCT: 38.2 % (ref 35.0–47.0)
Hemoglobin: 13.2 g/dL (ref 12.0–16.0)
MCH: 31.7 pg (ref 26.0–34.0)
MCHC: 34.5 g/dL (ref 32.0–36.0)
MCV: 92 fL (ref 80.0–100.0)
PLATELETS: 190 10*3/uL (ref 150–440)
RBC: 4.15 MIL/uL (ref 3.80–5.20)
RDW: 12.6 % (ref 11.5–14.5)
WBC: 5.5 10*3/uL (ref 3.6–11.0)

## 2015-10-27 LAB — URINALYSIS COMPLETE WITH MICROSCOPIC (ARMC ONLY)
BILIRUBIN URINE: NEGATIVE
GLUCOSE, UA: NEGATIVE mg/dL
KETONES UR: NEGATIVE mg/dL
Leukocytes, UA: NEGATIVE
Nitrite: NEGATIVE
Protein, ur: NEGATIVE mg/dL
Specific Gravity, Urine: 1.011 (ref 1.005–1.030)
pH: 6 (ref 5.0–8.0)

## 2015-10-27 LAB — LIPASE, BLOOD: LIPASE: 15 U/L (ref 11–51)

## 2015-10-27 MED ORDER — IOPAMIDOL (ISOVUE-300) INJECTION 61%
100.0000 mL | Freq: Once | INTRAVENOUS | Status: AC | PRN
  Administered 2015-10-27: 100 mL via INTRAVENOUS

## 2015-10-27 MED ORDER — TRAMADOL HCL 50 MG PO TABS: 50.0000 mg | ORAL_TABLET | Freq: Four times a day (QID) | ORAL | Status: AC | PRN

## 2015-10-27 MED ORDER — DIATRIZOATE MEGLUMINE & SODIUM 66-10 % PO SOLN
15.0000 mL | Freq: Once | ORAL | Status: AC
  Administered 2015-10-27: 15 mL via ORAL

## 2015-10-27 NOTE — Discharge Instructions (Signed)

## 2015-10-27 NOTE — ED Provider Notes (Signed)
[facility] Emergency Department Provider Note  ____________________________________________    I have reviewed the triage vital signs and the nursing notes.   HISTORY  Chief Complaint Abdominal Pain    HPI Katie Lambert is a 35 y.o. female who presents with complaints of lower abdominal pain for approximately 3 days. She reports his pain is steadily getting worse and describes it as a cramping pain which is moderate in nature and constant. She has never had this before. She does report when she finishes urinating she has discomfort in her superpubic region. She has had a hysterectomy and nephrectomy in the past. She is not having any vaginal discharge. She denies dysuria. No fevers or chills. No nausea or vomiting. Normal stools. No recent travel. She is a Emergency planning/management officer.     Past Medical History  Diagnosis Date  . HTN (hypertension)   . Acid reflux   . Dysrhythmia   . Arthritis     Patient Active Problem List   Diagnosis Date Noted  . HTN (hypertension) 05/11/2012  . Left ventricular hypertrophy 04/07/2012    Past Surgical History  Procedure Laterality Date  . Abdominal hysterectomy    . Knee surgery Left     x 2  . Knee arthroscopy Left 03/08/2015    Procedure: ARTHROSCOPY KNEE;  Surgeon: Juanell Fairly, MD;  Location: [facility] ORS;  Service: Orthopedics;  Laterality: Left;  . Tubal ligation    . Knee arthroscopy Left 07/13/2015    Procedure: ARTHROSCOPY KNEE, partial lateral menisetomy, chondroplasty lateral femoral chondyl;  Surgeon: Juanell Fairly, MD;  Location: [facility] ORS;  Service: Orthopedics;  Laterality: Left;    Current Outpatient Rx  Name  Route  Sig  Dispense  Refill  . amLODipine (NORVASC) 10 MG tablet   Oral   Take 1 tablet (10 mg total) by mouth daily. Patient taking differently: Take 10 mg by mouth at bedtime.          Marland Kitchen aspirin EC 325 MG tablet   Oral   Take 1 tablet (325 mg total) by mouth daily.   30 tablet   0    . hydrochlorothiazide (MICROZIDE) 12.5 MG capsule   Oral   Take 12.5 mg by mouth daily.         Marland Kitchen lisinopril (PRINIVIL,ZESTRIL) 20 MG tablet   Oral   Take 20 mg by mouth daily.         Marland Kitchen loratadine (CLARITIN) 10 MG tablet   Oral   Take 10 mg by mouth daily.         . meloxicam (MOBIC) 7.5 MG tablet   Oral   Take 7.5 mg by mouth daily.         . traMADol (ULTRAM) 50 MG tablet   Oral   Take 1 tablet (50 mg total) by mouth every 6 (six) hours as needed.   20 tablet   0     Allergies Review of patient's allergies indicates no known allergies.  Family History  Problem Relation Age of Onset  . Hypertension Father   . Depression Mother   . Hypertension Mother   . Coronary artery disease Maternal Grandfather 72    Social History Social History  Substance Use Topics  . Smoking status: Current Every Day Smoker -- 0.50 packs/day for 15 years    Types: Cigarettes  . Smokeless tobacco: Never Used     Comment: Currently electronic cigarettes  . Alcohol Use: Yes     Comment: social  Review of Systems  Constitutional: Negative for fever. Eyes: Negative for redness ENT: Negative for sore throat Cardiovascular: Negative for chest pain Respiratory: No cough Gastrointestinal: As above Genitourinary: Negative for dysuria. No vaginal discharge Musculoskeletal: Negative for back pain. Skin: Negative for rash. Neurological: Negative for headache Psychiatric: no anxiety    ____________________________________________   PHYSICAL EXAM:  VITAL SIGNS: ED Triage Vitals  Enc Vitals Group     BP 10/27/15 1024 138/95 mmHg     Pulse Rate 10/27/15 1024 73     Resp 10/27/15 1024 18     Temp 10/27/15 1024 98.3 F (36.8 C)     Temp Source 10/27/15 1024 Oral     SpO2 10/27/15 1024 98 %     Weight 10/27/15 1024 152 lb (68.947 kg)     Height 10/27/15 1024 5\' 5"  (1.651 m)     Head Cir --      Peak Flow --      Pain Score 10/27/15 1025 7     Pain Loc --      Pain  Edu? --      Excl. in GC? --      Constitutional: Alert and oriented. Well appearing and in no distress. Pleasant and interactive Eyes: Conjunctivae are normal. No erythema or injection ENT   Head: Normocephalic and atraumatic.   Mouth/Throat: Mucous membranes are moist. Cardiovascular: Normal rate, regular rhythm. Normal and symmetric distal pulses are present in the upper extremities.  Respiratory: Normal respiratory effort without tachypnea nor retractions. Breath sounds are clear and equal bilaterally.  Gastrointestinal: Patient with tenderness to palpation in the right lower quadrant, less so in the left lower quadrant. No rigidity. No distention. There is no CVA tenderness. Genitourinary: deferred Musculoskeletal: Nontender with normal range of motion in all extremities. No lower extremity tenderness nor edema. Neurologic:  Normal speech and language. No gross focal neurologic deficits are appreciated. Skin:  Skin is warm, dry and intact. No rash noted. Psychiatric: Mood and affect are normal. Patient exhibits appropriate insight and judgment.  ____________________________________________    LABS (pertinent positives/negatives)  Labs Reviewed  URINALYSIS COMPLETEWITH MICROSCOPIC ([facility] ONLY) - Abnormal; Notable for the following:    Color, Urine YELLOW (*)    APPearance CLEAR (*)    Hgb urine dipstick 1+ (*)    Bacteria, UA RARE (*)    Squamous Epithelial / LPF 0-5 (*)    All other components within normal limits  URINE CULTURE  LIPASE, BLOOD  COMPREHENSIVE METABOLIC PANEL  CBC    ____________________________________________   EKG  None  ____________________________________________    RADIOLOGY  CT scan unremarkable  ____________________________________________   PROCEDURES  Procedure(s) performed: none  Critical Care performed: none  ____________________________________________   INITIAL IMPRESSION / ASSESSMENT AND PLAN / ED  COURSE  Pertinent labs & imaging results that were available during my care of the patient were reviewed by me and considered in my medical decision making (see chart for details).  Patient presents with complaints of lower abdominal pain for approximately 3 days which is constant and seems to be worsening. She does have marked tenderness over the right lower quadrant and I'm suspicious for appendicitis. Mild hematuria also noted on UA so ureterolithiasis is a possibility but less likely. We will obtain CT abdomen pelvis. I offered the patient pain medication but she does not want any at this time.  CT abdomen pelvis unremarkable. Unclear cause of patient's pain. She does not have vaginal discharge and has had a hysterectomy  and oophorectomy. She would prefer not to have a pelvic exam done in the ED. We will send urine culture given mild hematuria. Discussed this case with the patient at length and we will try a course of pain medication and let the patient go home to see if symptoms improve or change in any way. Strict return precautions discussed. Patient agrees with this plan.    ____________________________________________   FINAL CLINICAL IMPRESSION(S) / ED DIAGNOSES  Final diagnoses:  Lower abdominal pain          Jene Everyobert Nikolina Simerson, MD 10/27/15 2042

## 2015-10-27 NOTE — ED Notes (Signed)
Pt reports lower abd pain for 3 days, increasingly worsened, pt states pain in her abd with urination

## 2015-10-27 NOTE — ED Notes (Signed)
Pt reports having RLQ pain that has worsened for the past 2-3 days. Pt reports the pain comes and goes in waves especially after urinating pain will increase. Pt is tender upon palpation of appendix and RLQ. Pt reports still having appendix.

## 2015-10-29 LAB — URINE CULTURE: Culture: 4000 — AB

## 2016-01-21 ENCOUNTER — Encounter: Payer: Self-pay | Admitting: Emergency Medicine

## 2016-01-21 ENCOUNTER — Emergency Department: Payer: BLUE CROSS/BLUE SHIELD

## 2016-01-21 ENCOUNTER — Emergency Department
Admission: EM | Admit: 2016-01-21 | Discharge: 2016-01-21 | Disposition: A | Discharge status: Home / Self Care | Payer: BLUE CROSS/BLUE SHIELD | Attending: Emergency Medicine | Admitting: Emergency Medicine

## 2016-01-21 DIAGNOSIS — Z7982 Long term (current) use of aspirin: Secondary | ICD-10-CM | POA: Insufficient documentation

## 2016-01-21 DIAGNOSIS — F17210 Nicotine dependence, cigarettes, uncomplicated: Secondary | ICD-10-CM | POA: Diagnosis not present

## 2016-01-21 DIAGNOSIS — R109 Unspecified abdominal pain: Secondary | ICD-10-CM | POA: Diagnosis present

## 2016-01-21 DIAGNOSIS — N200 Calculus of kidney: Secondary | ICD-10-CM | POA: Diagnosis not present

## 2016-01-21 DIAGNOSIS — I10 Essential (primary) hypertension: Secondary | ICD-10-CM | POA: Diagnosis not present

## 2016-01-21 LAB — URINALYSIS COMPLETE WITH MICROSCOPIC (ARMC ONLY)
BILIRUBIN URINE: NEGATIVE
GLUCOSE, UA: NEGATIVE mg/dL
Ketones, ur: NEGATIVE mg/dL
LEUKOCYTES UA: NEGATIVE
NITRITE: NEGATIVE
Protein, ur: 30 mg/dL — AB
SPECIFIC GRAVITY, URINE: 1.024 (ref 1.005–1.030)
pH: 6 (ref 5.0–8.0)

## 2016-01-21 LAB — CBC
HCT: 41 % (ref 35.0–47.0)
Hemoglobin: 14.3 g/dL (ref 12.0–16.0)
MCH: 32.2 pg (ref 26.0–34.0)
MCHC: 35 g/dL (ref 32.0–36.0)
MCV: 92.1 fL (ref 80.0–100.0)
PLATELETS: 193 10*3/uL (ref 150–440)
RBC: 4.45 MIL/uL (ref 3.80–5.20)
RDW: 12.5 % (ref 11.5–14.5)
WBC: 6.4 10*3/uL (ref 3.6–11.0)

## 2016-01-21 LAB — POCT PREGNANCY, URINE: Preg Test, Ur: NEGATIVE

## 2016-01-21 LAB — BASIC METABOLIC PANEL
Anion gap: 7 (ref 5–15)
BUN: 21 mg/dL — AB (ref 6–20)
CHLORIDE: 106 mmol/L (ref 101–111)
CO2: 26 mmol/L (ref 22–32)
CREATININE: 0.82 mg/dL (ref 0.44–1.00)
Calcium: 9.4 mg/dL (ref 8.9–10.3)
GFR calc Af Amer: >60 mL/min (ref >60)
GFR calc non Af Amer: >60 mL/min (ref >60)
Glucose, Bld: 97 mg/dL (ref 65–99)
Potassium: 3.8 mmol/L (ref 3.5–5.1)
Sodium: 139 mmol/L (ref 135–145)

## 2016-01-21 MED ORDER — KETOROLAC TROMETHAMINE 30 MG/ML IJ SOLN
30.0000 mg | Freq: Once | INTRAMUSCULAR | Status: AC
  Administered 2016-01-21: 30 mg via INTRAVENOUS
  Filled 2016-01-21: qty 1

## 2016-01-21 MED ORDER — SODIUM CHLORIDE 0.9 % IV BOLUS (SEPSIS)
1000.0000 mL | Freq: Once | INTRAVENOUS | Status: AC
  Administered 2016-01-21: 1000 mL via INTRAVENOUS

## 2016-01-21 NOTE — ED Provider Notes (Signed)
[facility] Emergency Department Provider Note  ____________________________________________  Time seen: Approximately 11:05 AM  I have reviewed the triage vital signs and the nursing notes.   HISTORY  Chief Complaint Urinary Urgency   HPI Katie Lambert is a 35 y.o. female who presents for evaluation of the urinary frequency and left-sided flank pain. Patient reports that she woke up this morning and since then has had frequency. She has gone to the bathroom multiple times but only small amount a urine. She denies dysuria or hematuria. When she was in the car coming to the hospital to be checked for urinary tract infection she developed sudden onset of left flank pain. She reports the pain is 10 out of 10, sharp, radiating to her left groin, that lasted about 30 minutes. The pain has now fully resolved. She continues to complain of urinary frequency. She has had no abdominal pain, no nausea, no vomiting. She does have a history of kidney stones but reports that the pain she had today was worse. She denies a history of urinary tract infections or pyelonephritis.  Past Medical History:  Diagnosis Date  . Acid reflux   . Arthritis   . Dysrhythmia   . HTN (hypertension)     Patient Active Problem List   Diagnosis Date Noted  . HTN (hypertension) 05/11/2012  . Left ventricular hypertrophy 04/07/2012    Past Surgical History:  Procedure Laterality Date  . ABDOMINAL HYSTERECTOMY    . KNEE ARTHROSCOPY Left 03/08/2015   Procedure: ARTHROSCOPY KNEE;  Surgeon: Juanell Fairly, MD;  Location: [facility] ORS;  Service: Orthopedics;  Laterality: Left;  . KNEE ARTHROSCOPY Left 07/13/2015   Procedure: ARTHROSCOPY KNEE, partial lateral menisetomy, chondroplasty lateral femoral chondyl;  Surgeon: Juanell Fairly, MD;  Location: [facility] ORS;  Service: Orthopedics;  Laterality: Left;  . KNEE SURGERY Left    x 2  . TUBAL LIGATION      Prior to Admission medications     Medication Sig Start Date End Date Taking? Authorizing Provider  amLODipine (NORVASC) 10 MG tablet Take 1 tablet (10 mg total) by mouth daily. Patient taking differently: Take 10 mg by mouth at bedtime.  04/07/12   Rollene Rotunda, MD  aspirin EC 325 MG tablet Take 1 tablet (325 mg total) by mouth daily. 07/13/15   Juanell Fairly, MD  hydrochlorothiazide (MICROZIDE) 12.5 MG capsule Take 12.5 mg by mouth daily.    Historical Provider, MD  lisinopril (PRINIVIL,ZESTRIL) 20 MG tablet Take 20 mg by mouth daily.    Historical Provider, MD  loratadine (CLARITIN) 10 MG tablet Take 10 mg by mouth daily.    Historical Provider, MD  meloxicam (MOBIC) 7.5 MG tablet Take 7.5 mg by mouth daily.    Historical Provider, MD  traMADol (ULTRAM) 50 MG tablet Take 1 tablet (50 mg total) by mouth every 6 (six) hours as needed. 10/27/15 10/26/16  Jene Every, MD    Allergies Review of patient's allergies indicates no known allergies.  Family History  Problem Relation Age of Onset  . Hypertension Father   . Depression Mother   . Hypertension Mother   . Coronary artery disease Maternal Grandfather 47    Social History Social History  Substance Use Topics  . Smoking status: Current Every Day Smoker    Packs/day: 0.50    Years: 15.00    Types: Cigarettes  . Smokeless tobacco: Never Used     Comment: Currently electronic cigarettes  . Alcohol use Yes  Comment: social    Review of Systems  Constitutional: Negative for fever. Eyes: Negative for visual changes. ENT: Negative for sore throat. Cardiovascular: Negative for chest pain. Respiratory: Negative for shortness of breath. Gastrointestinal: Negative for abdominal pain, vomiting or diarrhea. Genitourinary: Negative for dysuria. + frequency and L flank pain Musculoskeletal: Negative for back pain. Skin: Negative for rash. Neurological: Negative for headaches, weakness or numbness.  ____________________________________________   PHYSICAL  EXAM:  VITAL SIGNS: ED Triage Vitals  Enc Vitals Group     BP 01/21/16 1039 (!) 146/106     Pulse Rate 01/21/16 1039 73     Resp 01/21/16 1039 18     Temp 01/21/16 1039 98.8 F (37.1 C)     Temp Source 01/21/16 1039 Oral     SpO2 01/21/16 1039 100 %     Weight 01/21/16 1039 152 lb (68.9 kg)     Height 01/21/16 1039 5\' 5"  (1.651 m)     Head Circumference --      Peak Flow --      Pain Score 01/21/16 1040 10     Pain Loc --      Pain Edu? --      Excl. in GC? --     Constitutional: Alert and oriented. Well appearing and in no apparent distress. HEENT:      Head: Normocephalic and atraumatic.         Eyes: Conjunctivae are normal. Sclera is non-icteric. EOMI. PERRL      Mouth/Throat: Mucous membranes are moist.       Neck: Supple with no signs of meningismus. Cardiovascular: Regular rate and rhythm. No murmurs, gallops, or rubs. 2+ symmetrical distal pulses are present in all extremities. No JVD. Respiratory: Normal respiratory effort. Lungs are clear to auscultation bilaterally. No wheezes, crackles, or rhonchi.  Gastrointestinal: Soft, non tender, and non distended with positive bowel sounds. No rebound or guarding. Genitourinary: Left CVA tenderness. Musculoskeletal: Nontender with normal range of motion in all extremities. No edema, cyanosis, or erythema of extremities. Neurologic: Normal speech and language. Face is symmetric. Moving all extremities. No gross focal neurologic deficits are appreciated. Skin: Skin is warm, dry and intact. No rash noted. Psychiatric: Mood and affect are normal. Speech and behavior are normal.  ____________________________________________   LABS (all labs ordered are listed, but only abnormal results are displayed)  Labs Reviewed  URINALYSIS COMPLETEWITH MICROSCOPIC ([facility] ONLY) - Abnormal; Notable for the following:       Result Value   Color, Urine YELLOW (*)    APPearance CLEAR (*)    Hgb urine dipstick 3+ (*)    Protein, ur 30 (*)     Bacteria, UA RARE (*)    Squamous Epithelial / LPF 0-5 (*)    All other components within normal limits  BASIC METABOLIC PANEL - Abnormal; Notable for the following:    BUN 21 (*)    All other components within normal limits  URINE CULTURE  CBC  POC URINE PREG, ED  POCT PREGNANCY, URINE   ____________________________________________  EKG  none  ____________________________________________  RADIOLOGY  CT renaL;  2-3 mm calculus within the area of the left trigone representing either recently passed calculus or calculus within the intravesicular portion of the distal left ureter. No evidence of obstructive uropathy. No evidence of nephrolithiasis. ____________________________________________   PROCEDURES  Procedure(s) performed: None Procedures Critical Care performed:  None ____________________________________________   INITIAL IMPRESSION / ASSESSMENT AND PLAN / ED COURSE  35 y.o. female who  presents for evaluation of the urinary frequency and left-sided flank pain. CT scan showing recently passed 2-3 mm kidney stone in the left side. UA with no leuk esterase, no nitrites, no signs of infection. Urine culture is pending. Patient no longer having pain. Will dc home on supportive care and close follow up with PCP.  Clinical Course    Pertinent labs & imaging results that were available during my care of the patient were reviewed by me and considered in my medical decision making (see chart for details).    ____________________________________________   FINAL CLINICAL IMPRESSION(S) / ED DIAGNOSES  Final diagnoses:  Kidney stone      NEW MEDICATIONS STARTED DURING THIS VISIT:  New Prescriptions   No medications on file     Note:  This document was prepared using Dragon voice recognition software and may include unintentional dictation errors.    Nita Sickle, MD 01/21/16 1229

## 2016-01-21 NOTE — Discharge Instructions (Signed)
You were seen for pain and diagnosed a kidney stone that probably passed. Drink lots of fluids and take ibuprofen as needed for pain. If by tomorrow you're having pain or burning when you pee or your urinary symptoms have not resolved, follow-up with your doctor for repeat urinalysis. If you start to have fever or the pain returns please return to the emergency department.

## 2016-01-21 NOTE — ED Notes (Signed)

## 2016-01-21 NOTE — ED Triage Notes (Signed)
Patient arrives from home with c/o left sided flank pain. Patient describes it as a "spasm". Also patient is describing urinary urgency with dribbling.

## 2016-01-22 LAB — URINE CULTURE: CULTURE: NO GROWTH

## 2016-04-04 ENCOUNTER — Other Ambulatory Visit: Payer: Self-pay | Admitting: Family Medicine

## 2016-04-04 ENCOUNTER — Ambulatory Visit
Admission: RE | Admit: 2016-04-04 | Discharge: 2016-04-04 | Disposition: A | Discharge status: Home / Self Care | Payer: BLUE CROSS/BLUE SHIELD | Source: Ambulatory Visit | Attending: Family Medicine | Admitting: Family Medicine

## 2016-04-04 DIAGNOSIS — M25511 Pain in right shoulder: Secondary | ICD-10-CM | POA: Insufficient documentation

## 2016-04-04 DIAGNOSIS — R52 Pain, unspecified: Secondary | ICD-10-CM

## 2016-05-14 ENCOUNTER — Institutional Professional Consult (permissible substitution): Payer: BLUE CROSS/BLUE SHIELD | Admitting: Sports Medicine

## 2016-05-15 ENCOUNTER — Encounter: Payer: Self-pay | Admitting: Sports Medicine

## 2016-05-15 ENCOUNTER — Ambulatory Visit (INDEPENDENT_AMBULATORY_CARE_PROVIDER_SITE_OTHER): Payer: BLUE CROSS/BLUE SHIELD | Admitting: Sports Medicine

## 2016-05-15 ENCOUNTER — Ambulatory Visit (INDEPENDENT_AMBULATORY_CARE_PROVIDER_SITE_OTHER): Payer: BLUE CROSS/BLUE SHIELD

## 2016-05-15 DIAGNOSIS — M24111 Other articular cartilage disorders, right shoulder: Secondary | ICD-10-CM | POA: Diagnosis not present

## 2016-05-15 DIAGNOSIS — M5412 Radiculopathy, cervical region: Secondary | ICD-10-CM

## 2016-05-15 NOTE — Progress Notes (Signed)
   Subjective:    I'm seeing this patient as a consultation for:  Dr. Tarri AbernethyJoseph Rabinowitz  CC: right shoulder pain  HPI: This is a pleasant 35 year old female, she for some time she's had pain in her right shoulder, anterior, worse with reaching across her chest and overhead activities, minimal mechanical symptoms including popping and catching. [facility], this showed a small posterior and a large anterior labral cyst. No rotator cuff tears, mild acromioclavicular osteoarthritis. Pain is moderate, persistent without radiation.  She also has a complaint of paresthesias running down her arm to her hand with numbness and tingling.  Past medical history:  Negative.  See flowsheet/record as well for more information.  Surgical history: Negative.  See flowsheet/record as well for more information.  Family history: Negative.  See flowsheet/record as well for more information.  Social history: Negative.  See flowsheet/record as well for more information.  Allergies, and medications have been entered into the medical record, reviewed, and no changes needed.   Review of Systems: No headache, visual changes, nausea, vomiting, diarrhea, constipation, dizziness, abdominal pain, skin rash, fevers, chills, night sweats, weight loss, swollen lymph nodes, body aches, joint swelling, muscle aches, chest pain, shortness of breath, mood changes, visual or auditory hallucinations.   Objective:   General: Well Developed, well nourished, and in no acute distress.  Neuro/Psych: Alert and oriented x3, extra-ocular muscles intact, able to move all 4 extremities, sensation grossly intact. Skin: Warm and dry, no rashes noted.  Respiratory: Not using accessory muscles, speaking in full sentences, trachea midline.  Cardiovascular: Pulses palpable, no extremity edema. Abdomen: Does not appear distended. rightShoulder: Inspection reveals no abnormalities, atrophy or  asymmetry. Palpation is normal with no tenderness over AC joint or bicipital groove. ROM is full in all planes. Rotator cuff strength normal throughout. No signs of impingement with negative Neer and Hawkin's tests, empty can. Speeds and Yergason's tests normal. positive Obrien's, negative crank, positive clunk, and 2+ anterior translational instability Normal scapular function observed. No painful arc and no drop arm sign. No apprehension sign  Procedure: Real-time Ultrasound Guided Injection of right glenohumeral joint  Device: GE Logiq E  Verbal informed consent obtained.  Time-out conducted.  Noted no overlying erythema, induration, or other signs of local infection.  Skin prepped in a sterile fashion.  Local anesthesia: Topical Ethyl chloride.  With sterile technique and under real time ultrasound guidance:  1 mL kenalog 40, 2 mL lidocaine, 2 mL Marcaine injected easily Completed without difficulty  Pain immediately resolved suggesting accurate placement of the medication.  Advised to call if fevers/chills, erythema, induration, drainage, or persistent bleeding.  Images permanently stored and available for review in the ultrasound unit.  Impression: Technically successful ultrasound guided injection.  Impression and Recommendations:   This case required medical decision making of moderate complexity.  Labral tear of shoulder, degenerative, right, with large anterior labral cyst Unable to see the anterior or labral cyst on ultrasound. Using a posterior approach I injected the glenohumeral joint. Continue home rehabilitation exercises, return to see me in one month. She will get the MRI on a disc.  Right cervical radiculopathy Neck x-rays. We will see how this does after injection in her shoulder, insufficient improvement I will have her do some more neck rehabilitation before considering MRI.

## 2016-05-15 NOTE — Assessment & Plan Note (Signed)
Neck x-rays. We will see how this does after injection in her shoulder, insufficient improvement I will have her do some more neck rehabilitation before considering MRI.

## 2016-05-15 NOTE — Assessment & Plan Note (Addendum)
Unable to see the anterior or labral cyst on ultrasound. Using a posterior approach I injected the glenohumeral joint. Continue home rehabilitation exercises, return to see me in one month. She will get the MRI on a disc.

## 2016-05-23 ENCOUNTER — Encounter: Payer: Self-pay | Admitting: Sports Medicine

## 2016-06-05 ENCOUNTER — Emergency Department: Payer: Worker's Compensation

## 2016-06-05 ENCOUNTER — Emergency Department
Admission: EM | Admit: 2016-06-05 | Discharge: 2016-06-05 | Disposition: A | Discharge status: Home / Self Care | Payer: Worker's Compensation | Attending: Emergency Medicine | Admitting: Emergency Medicine

## 2016-06-05 DIAGNOSIS — S0031XA Abrasion of nose, initial encounter: Secondary | ICD-10-CM | POA: Diagnosis not present

## 2016-06-05 DIAGNOSIS — Y9289 Other specified places as the place of occurrence of the external cause: Secondary | ICD-10-CM | POA: Diagnosis not present

## 2016-06-05 DIAGNOSIS — Y9389 Activity, other specified: Secondary | ICD-10-CM | POA: Diagnosis not present

## 2016-06-05 DIAGNOSIS — F17210 Nicotine dependence, cigarettes, uncomplicated: Secondary | ICD-10-CM | POA: Diagnosis not present

## 2016-06-05 DIAGNOSIS — Z79899 Other long term (current) drug therapy: Secondary | ICD-10-CM | POA: Diagnosis not present

## 2016-06-05 DIAGNOSIS — S99912A Unspecified injury of left ankle, initial encounter: Secondary | ICD-10-CM | POA: Diagnosis present

## 2016-06-05 DIAGNOSIS — W1800XA Striking against unspecified object with subsequent fall, initial encounter: Secondary | ICD-10-CM | POA: Diagnosis not present

## 2016-06-05 DIAGNOSIS — I10 Essential (primary) hypertension: Secondary | ICD-10-CM | POA: Insufficient documentation

## 2016-06-05 DIAGNOSIS — S93402A Sprain of unspecified ligament of left ankle, initial encounter: Secondary | ICD-10-CM | POA: Insufficient documentation

## 2016-06-05 DIAGNOSIS — Z7982 Long term (current) use of aspirin: Secondary | ICD-10-CM | POA: Insufficient documentation

## 2016-06-05 DIAGNOSIS — Y990 Civilian activity done for income or pay: Secondary | ICD-10-CM | POA: Diagnosis not present

## 2016-06-05 MED ORDER — OXYCODONE-ACETAMINOPHEN 5-325 MG PO TABS: 1.0000 | ORAL_TABLET | ORAL | 0 refills | Status: DC | PRN

## 2016-06-05 MED ORDER — OXYCODONE-ACETAMINOPHEN 5-325 MG PO TABS
1.0000 | ORAL_TABLET | Freq: Once | ORAL | Status: AC
  Administered 2016-06-05: 1 via ORAL

## 2016-06-05 MED ORDER — OXYCODONE-ACETAMINOPHEN 5-325 MG PO TABS
ORAL_TABLET | ORAL | Status: AC
  Administered 2016-06-05: 1 via ORAL
  Filled 2016-06-05: qty 1

## 2016-06-05 NOTE — ED Provider Notes (Signed)
[facility] Emergency Department Provider Note   First MD Initiated Contact with Patient 06/05/16 [phone]     (approximate)  I have reviewed the triage vital signs and the nursing notes.   HISTORY  Chief Complaint Ankle Pain   HPI Katie Lambert is a 35 y.o. female with below list of chronic conditions presents with history of stepping in a hole during a training exercise to [facility] injuring her left ankle. She states her current pain score is 5 out of 10 however worsened with movement. Patient also states that she was scratched on the nasal bridge by a branch before she fell.   Past Medical History:  Diagnosis Date  . Acid reflux   . Arthritis   . Dysrhythmia   . HTN (hypertension)     Patient Active Problem List   Diagnosis Date Noted  . Labral tear of shoulder, degenerative, right, with large anterior labral cyst 05/15/2016  . Right cervical radiculopathy 05/15/2016  . HTN (hypertension) 05/11/2012  . Left ventricular hypertrophy 04/07/2012    Past Surgical History:  Procedure Laterality Date  . ABDOMINAL HYSTERECTOMY    . KNEE ARTHROSCOPY Left 03/08/2015   Procedure: ARTHROSCOPY KNEE;  Surgeon: Juanell Fairly, MD;  Location: [facility] ORS;  Service: Orthopedics;  Laterality: Left;  . KNEE ARTHROSCOPY Left 07/13/2015   Procedure: ARTHROSCOPY KNEE, partial lateral menisetomy, chondroplasty lateral femoral chondyl;  Surgeon: Juanell Fairly, MD;  Location: [facility] ORS;  Service: Orthopedics;  Laterality: Left;  . KNEE SURGERY Left    x 2  . TUBAL LIGATION      Prior to Admission medications   Medication Sig Start Date End Date Taking? Authorizing Provider  amLODipine (NORVASC) 10 MG tablet Take 1 tablet (10 mg total) by mouth daily. Patient taking differently: Take 10 mg by mouth at bedtime.  04/07/12   Rollene Rotunda, MD  aspirin EC 325 MG tablet Take 1 tablet (325 mg total) by mouth daily. 07/13/15   Juanell Fairly, MD    hydrochlorothiazide (MICROZIDE) 12.5 MG capsule Take 12.5 mg by mouth daily.    Historical Provider, MD  lisinopril (PRINIVIL,ZESTRIL) 20 MG tablet Take 20 mg by mouth daily.    Historical Provider, MD  loratadine (CLARITIN) 10 MG tablet Take 10 mg by mouth daily.    Historical Provider, MD  meloxicam (MOBIC) 7.5 MG tablet Take 7.5 mg by mouth daily.    Historical Provider, MD  oxyCODONE-acetaminophen (ROXICET) 5-325 MG tablet Take 1 tablet by mouth every 4 (four) hours as needed for severe pain. 06/05/16   Darci Current, MD  traMADol (ULTRAM) 50 MG tablet Take 1 tablet (50 mg total) by mouth every 6 (six) hours as needed. 10/27/15 10/26/16  Jene Every, MD    Allergies Patient has no known allergies.  Family History  Problem Relation Age of Onset  . Hypertension Father   . Depression Mother   . Hypertension Mother   . Coronary artery disease Maternal Grandfather 55    Social History Social History  Substance Use Topics  . Smoking status: Current Every Day Smoker    Packs/day: 0.50    Years: 15.00    Types: Cigarettes  . Smokeless tobacco: Never Used     Comment: Currently electronic cigarettes  . Alcohol use Yes     Comment: social    Review of Systems Constitutional: No fever/chills Eyes: No visual changes. ENT: No sore throat.Positive for nasal bridge abrasion Cardiovascular: Denies chest pain. Respiratory: Denies shortness of  breath. Gastrointestinal: No abdominal pain.  No nausea, no vomiting.  No diarrhea.  No constipation. Genitourinary: Negative for dysuria. Musculoskeletal: Negative for back pain. Positive for left ankle pain and swelling Skin: Negative for rash. Neurological: Negative for headaches, focal weakness or numbness.  10-point ROS otherwise negative.  ____________________________________________   PHYSICAL EXAM:  VITAL SIGNS: ED Triage Vitals  Enc Vitals Group     BP --      Pulse --      Resp --      Temp --      Temp src --      SpO2  --      Weight 06/05/16 0342 156 lb (70.8 kg)     Height 06/05/16 0342 5\' 5"  (1.651 m)     Head Circumference --      Peak Flow --      Pain Score 06/05/16 0344 5     Pain Loc --      Pain Edu? --      Excl. in GC? --     Constitutional: Alert and oriented. Well appearing and in no acute distress. Eyes: Conjunctivae are normal. PERRL. EOMI. Head: Atraumatic. Ears:  Healthy appearing ear canals and TMs bilaterally Nose: No congestion/rhinnorhea. Mouth/Throat: Mucous membranes are moist.  Oropharynx non-erythematous. Neck: No stridor.  No meningeal signs.  No cervical spine tenderness to palpation. Cardiovascular: Normal rate, regular rhythm. Good peripheral circulation. Grossly normal heart sounds. Respiratory: Normal respiratory effort.  No retractions. Lungs CTAB. Gastrointestinal: Soft and nontender. No distention.  Musculoskeletal: Left ankle pain with lateral malleoli palpation, swelling Neurologic:  Normal speech and language. No gross focal neurologic deficits are appreciated.  Skin: Abrasion on the nasal bridge   ____________________________________________   LABS (all labs ordered are listed, but only abnormal results are displayed)  Labs Reviewed - No data to display ____________________________________________  RADIOLOGY I, [place] Dewayne ShorterN BROWN, personally viewed and evaluated these images (plain radiographs) as part of my medical decision making, as well as reviewing the written report by the radiologist.  Dg Ankle Complete Left  Result Date: 06/05/2016 CLINICAL DATA:  35 year old female with left ankle injury. EXAM: LEFT ANKLE COMPLETE - 3+ VIEW COMPARISON:  None. FINDINGS: There is no acute fracture or dislocation. The bones are well mineralized. No arthritic changes. The ankle mortise is intact. There is mild soft tissue swelling of the ankle and over the lateral malleolus. IMPRESSION: No acute fracture or dislocation. Electronically Signed   By: Elgie CollardArash  Radparvar  M.D.   On: 06/05/2016 04:12    _______________ Procedures      INITIAL IMPRESSION / ASSESSMENT AND PLAN / ED COURSE  Pertinent labs & imaging results that were available during my care of the patient were reviewed by me and considered in my medical decision making (see chart for details).  History physical exam and x-ray consistent with left ankle sprain as such ankle stirrup splint applied crutches given. Patient prescribe Percocet as needed for pain at home and advised patient not to work or drive while taking Percocet.   Clinical Course     ____________________________________________  FINAL CLINICAL IMPRESSION(S) / ED DIAGNOSES  Final diagnoses:  Sprain of left ankle, unspecified ligament, initial encounter     MEDICATIONS GIVEN DURING THIS VISIT:  Medications  oxyCODONE-acetaminophen (PERCOCET/ROXICET) 5-325 MG per tablet 1 tablet (1 tablet Oral Given 06/05/16 0446)     NEW OUTPATIENT MEDICATIONS STARTED DURING THIS VISIT:  New Prescriptions   OXYCODONE-ACETAMINOPHEN (ROXICET) 5-325 MG TABLET  Take 1 tablet by mouth every 4 (four) hours as needed for severe pain.    Modified Medications   No medications on file    Discontinued Medications   No medications on file     Note:  This document was prepared using Dragon voice recognition software and may include unintentional dictation errors.    Darci Currentandolph N Brown, MD 06/05/16 [phone]

## 2016-06-05 NOTE — ED Triage Notes (Signed)
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

## 2016-06-05 NOTE — ED Notes (Signed)
Patient discharged to home per MD order. Patient in stable condition, and deemed medically cleared by ED provider for discharge. Discharge instructions reviewed with patient/family using "Teach Back"; verbalized understanding of medication education and administration, and information about follow-up care. Denies further concerns. ° °

## 2016-06-13 ENCOUNTER — Encounter: Payer: Self-pay | Admitting: Sports Medicine

## 2016-06-13 ENCOUNTER — Ambulatory Visit (INDEPENDENT_AMBULATORY_CARE_PROVIDER_SITE_OTHER): Payer: BLUE CROSS/BLUE SHIELD | Admitting: Sports Medicine

## 2016-06-13 DIAGNOSIS — S93402A Sprain of unspecified ligament of left ankle, initial encounter: Secondary | ICD-10-CM | POA: Insufficient documentation

## 2016-06-13 DIAGNOSIS — S93412A Sprain of calcaneofibular ligament of left ankle, initial encounter: Secondary | ICD-10-CM | POA: Diagnosis not present

## 2016-06-13 DIAGNOSIS — M24111 Other articular cartilage disorders, right shoulder: Secondary | ICD-10-CM | POA: Diagnosis not present

## 2016-06-13 NOTE — Assessment & Plan Note (Signed)
Really doesn't have any more labral or glenohumeral referable pain, it's all subacromial impingement related. I was able to look at her MRI that shows the anterior labral cyst sitting deep to the coracoid process. Return to see me in one month.

## 2016-06-13 NOTE — Progress Notes (Signed)
  Subjective:    CC: follow-up  HPI: Right shoulder pain: MRI from outside facility did show a large anterior labral cyst, labral degenerative changes, infraspinatus tendinopathy and acromioclavicular osteoarthritis. We did a glenohumeral injection at the last visit that provided moderate relief, she still has pain over the deltoid, worse with overhead activities and highly impingement related.  Left foot injury: Occurred while chasing a suspect. Inverted her foot and had persistent pain, swelling, she did have x-rays done at an outside facility that were negative for fracture. Pain is on the posterior lateral aspect of her foot.  Past medical history:  Negative.  See flowsheet/record as well for more information.  Surgical history: Negative.  See flowsheet/record as well for more information.  Family history: Negative.  See flowsheet/record as well for more information.  Social history: Negative.  See flowsheet/record as well for more information.  Allergies, and medications have been entered into the medical record, reviewed, and no changes needed.   Review of Systems: No fevers, chills, night sweats, weight loss, chest pain, or shortness of breath.   Objective:    General: Well Developed, well nourished, and in no acute distress.  Neuro: Alert and oriented x3, extra-ocular muscles intact, sensation grossly intact.  HEENT: Normocephalic, atraumatic, pupils equal round reactive to light, neck supple, no masses, no lymphadenopathy, thyroid nonpalpable.  Skin: Warm and dry, no rashes. Cardiac: Regular rate and rhythm, no murmurs rubs or gallops, no lower extremity edema.  Respiratory: Clear to auscultation bilaterally. Not using accessory muscles, speaking in full sentences. Left Ankle: Visibly swollen, tenderness over the calcaneofibular ligament. Range of motion is full in all directions. Strength is 5/5 in all directions. Stable lateral and medial ligaments; squeeze test and kleiger  test unremarkable; Talar dome nontender; No pain at base of 5th MT; No tenderness over cuboid; No tenderness over N spot or navicular prominence Tender near the calcaneofibular ligament No sign of peroneal tendon subluxations; Negative tarsal tunnel tinel's Able to walk 4 steps.  Procedure: Real-time Ultrasound Guided Injection of right subacromial bursa Device: GE Logiq E  Verbal informed consent obtained.  Time-out conducted.  Noted no overlying erythema, induration, or other signs of local infection.  Skin prepped in a sterile fashion.  Local anesthesia: Topical Ethyl chloride.  With sterile technique and under real time ultrasound guidance:  25-gauge needle advanced into the subacromial bursa, and 1 mL kenalog 40, 1 mL lidocaine, 1 mL Marcaine injected easily. Completed without difficulty  Pain immediately resolved suggesting accurate placement of the medication.  Advised to call if fevers/chills, erythema, induration, drainage, or persistent bleeding.  Images permanently stored and available for review in the ultrasound unit.  Impression: Technically successful ultrasound guided injection.  Impression and Recommendations:    Labral tear of shoulder, degenerative, right, with large anterior labral cyst Really doesn't have any more labral or glenohumeral referable pain, it's all subacromial impingement related. I was able to look at her MRI that shows the anterior labral cyst sitting deep to the coracoid process. Return to see me in one month.  Sprain of ankle, left Calcaneofibular ligament sprain, bilateral ASO. She will be on desk duty for the next 2 weeks. Ankle rehabilitation exercises at home.  I spent 25 minutes with this patient, greater than 50% was face-to-face time counseling regarding the above diagnoses this was separate from the time spent performing the above procedure

## 2016-06-13 NOTE — Assessment & Plan Note (Signed)
Calcaneofibular ligament sprain, bilateral ASO. She will be on desk duty for the next 2 weeks. Ankle rehabilitation exercises at home.

## 2016-07-11 ENCOUNTER — Ambulatory Visit: Payer: Self-pay | Admitting: Sports Medicine

## 2016-08-08 ENCOUNTER — Other Ambulatory Visit
Admit: 2016-08-08 | Discharge: 2016-08-08 | Disposition: A | Discharge status: Home / Self Care | Payer: PRIVATE HEALTH INSURANCE | Attending: Family Medicine | Admitting: Family Medicine

## 2016-08-08 NOTE — ED Notes (Addendum)
Patient ambulatory to triage with steady gait, without difficulty or distress noted; pt employeed with [employer], st here for post accident screen; denies any c/o or desire to be evaluated by ED provider; per supervisor, Lisette Grinderom Meisenbach, spoke to Claudell KyleSheila Traywick, who st to do UDS per workers comp profile; EDT, Mayra, in to perform drug screen per chain of custody; instr pt to return for any new or worsening symptoms

## 2016-10-03 ENCOUNTER — Encounter: Payer: Self-pay | Admitting: Sports Medicine

## 2016-11-04 ENCOUNTER — Encounter: Payer: Self-pay | Admitting: Sports Medicine

## 2016-11-04 ENCOUNTER — Ambulatory Visit (INDEPENDENT_AMBULATORY_CARE_PROVIDER_SITE_OTHER): Payer: BLUE CROSS/BLUE SHIELD | Admitting: Sports Medicine

## 2016-11-04 DIAGNOSIS — M24111 Other articular cartilage disorders, right shoulder: Secondary | ICD-10-CM

## 2016-11-04 NOTE — Assessment & Plan Note (Addendum)
Did well with the glenohumeral injection back in November, joint related pain is gone, subacromial injection in December worked well until recently, now has pain over the deltoid, with impingement type signs. Subacromial injection as above, return as needed, emphasized the importance of increasing diligence with rehabilitation exercises.  Unfortunately they are moving to [place], [place] in a few months.

## 2016-11-04 NOTE — Progress Notes (Addendum)
  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 She's now having recurrence of pain over the deltoid, worse with overhead activities, moderate, persistent without radiation, desires interventional treatment today.  Past medical history:  Negative.  See flowsheet/record as well for more information.  Surgical history: Negative.  See flowsheet/record as well for more information.  Family history: Negative.  See flowsheet/record as well for more information.  Social history: Negative.  See flowsheet/record as well for more information.  Allergies, and medications have been entered into the medical record, reviewed, and no changes needed.   Review of Systems: No fevers, chills, night sweats, weight loss, chest pain, or shortness of breath.   Objective:    General: Well Developed, well nourished, and in no acute distress.  Neuro: Alert and oriented x3, extra-ocular muscles intact, sensation grossly intact.  HEENT: Normocephalic, atraumatic, pupils equal round reactive to light, neck supple, no masses, no lymphadenopathy, thyroid nonpalpable.  Skin: Warm and dry, no rashes. Cardiac: Regular rate and rhythm, no murmurs rubs or gallops, no lower extremity edema.  Respiratory: Clear to auscultation bilaterally. Not using accessory muscles, speaking in full sentences. Right Shoulder: Inspection reveals no abnormalities, atrophy or asymmetry. Palpation is normal with no tenderness over AC joint or bicipital groove. ROM is full in all planes. Rotator cuff strength normal throughout. Positive Neer and Hawkin's tests, empty can. Speeds and Yergason's tests normal. No labral pathology noted with negative Obrien's, negative crank, negative clunk,  and good stability. Normal scapular function observed. No painful arc and no drop arm sign. No apprehension sign  Procedure: Real-time Ultrasound Guided Injection of right subacromial bursa Device: GE Logiq E  Verbal informed consent obtained.  Time-out conducted.  Noted no overlying erythema, induration, or other signs of local infection.  Skin prepped in a sterile fashion.  Local anesthesia: Topical Ethyl chloride.  With sterile technique and under real time ultrasound guidance:  Noted intact supraspinatus, 1 mL kenalog 40, 1 mL lidocaine, 1 mL bupivacaine injected easily. Completed without difficulty  Pain immediately resolved suggesting accurate placement of the medication.  Advised to call if fevers/chills, erythema, induration, drainage, or persistent bleeding.  Images permanently stored and available for review in the ultrasound unit.  Impression: Technically successful ultrasound guided injection.  Impression and Recommendations:    Labral tear of shoulder, degenerative, right, with large anterior labral cyst Did well with the glenohumeral injection back in November, joint related pain is gone, subacromial injection in December worked well until recently, now has pain over the deltoid, with impingement type signs. Subacromial injection as above, return as needed, emphasized the importance of increasing diligence with rehabilitation exercises.  Unfortunately they are moving to [place] in a few months.

## 2017-04-21 ENCOUNTER — Encounter: Payer: Self-pay | Admitting: Sports Medicine

## 2017-05-07 IMAGING — CR DG SHOULDER 2+V*R*
1 series · 3 of 3 positions shown · non-contrast
Comparison: None.

CLINICAL DATA: Right shoulder pain, no known injury, initial
encounter

EXAM:
RIGHT SHOULDER - 2+ VIEW

[Series 1: dg shoulder right · 0.14mm/px · 3 of 3 slices shown]
[im 1/3]
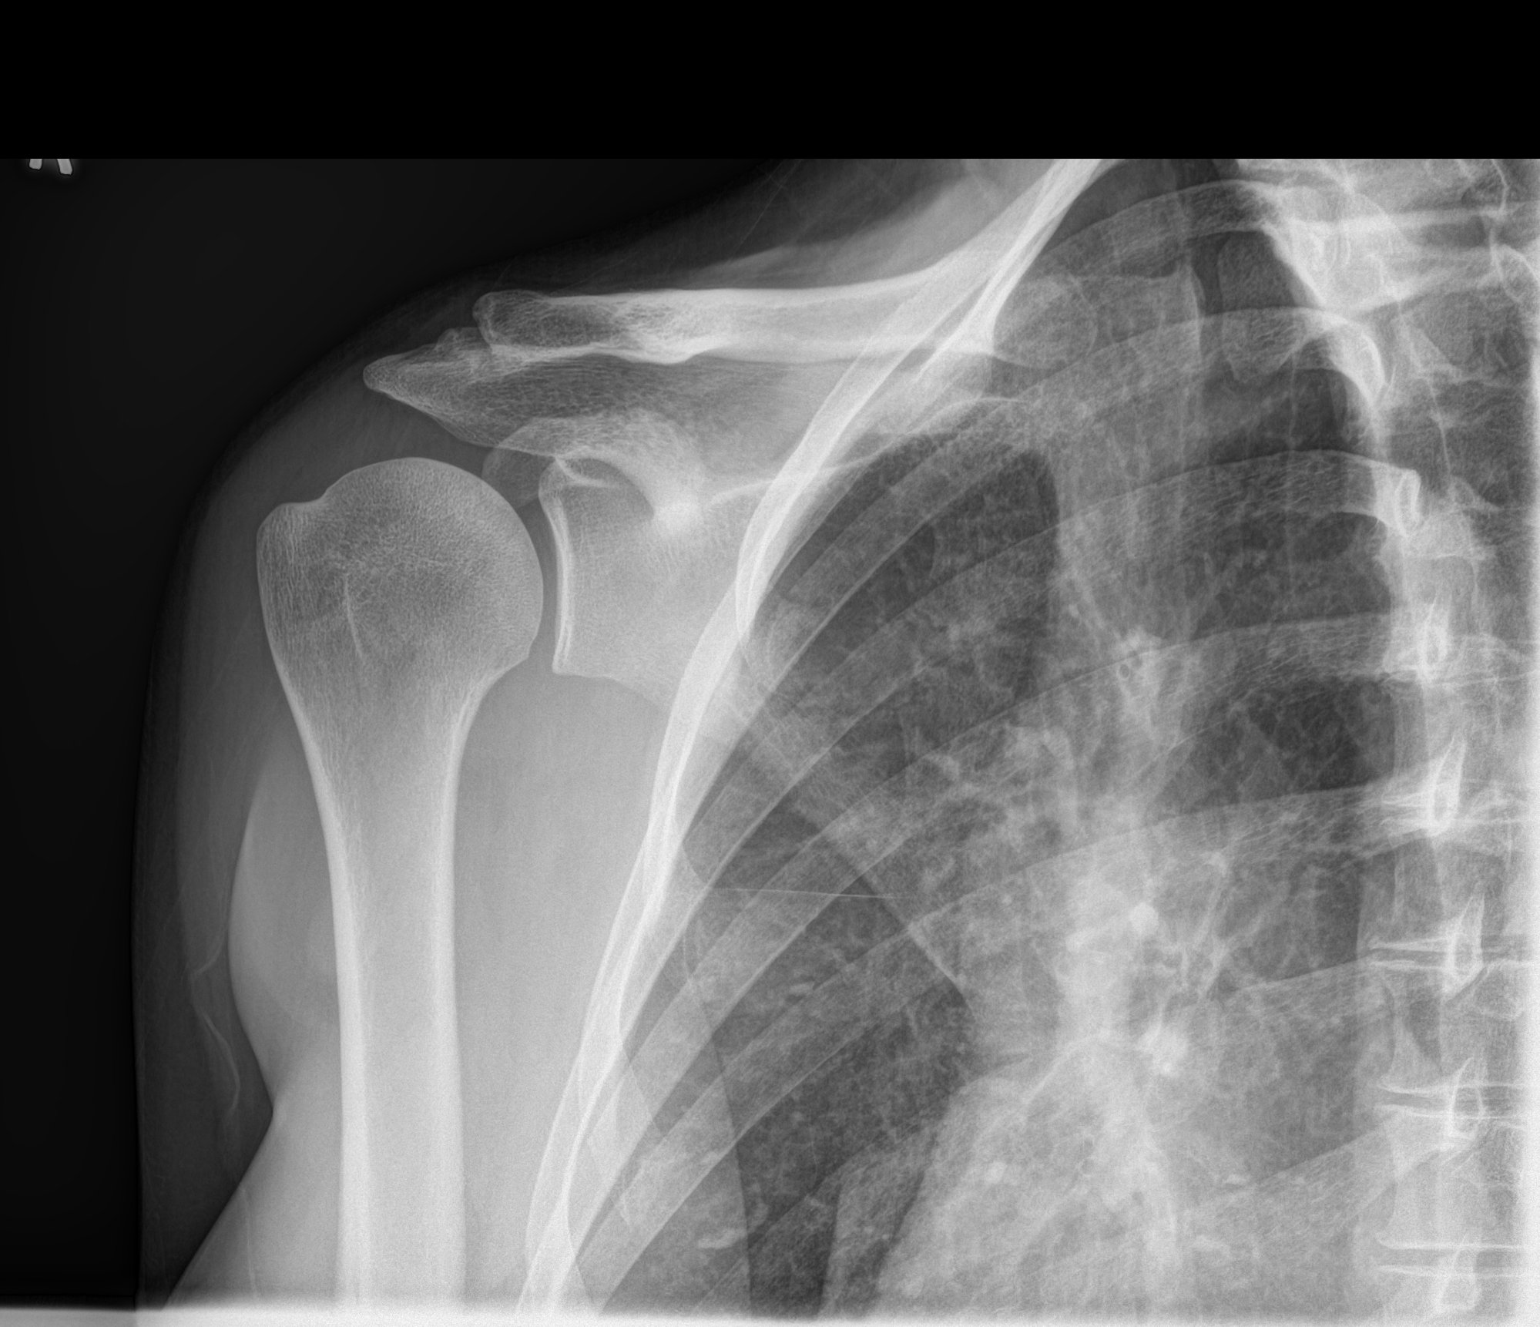
[im 2/3]
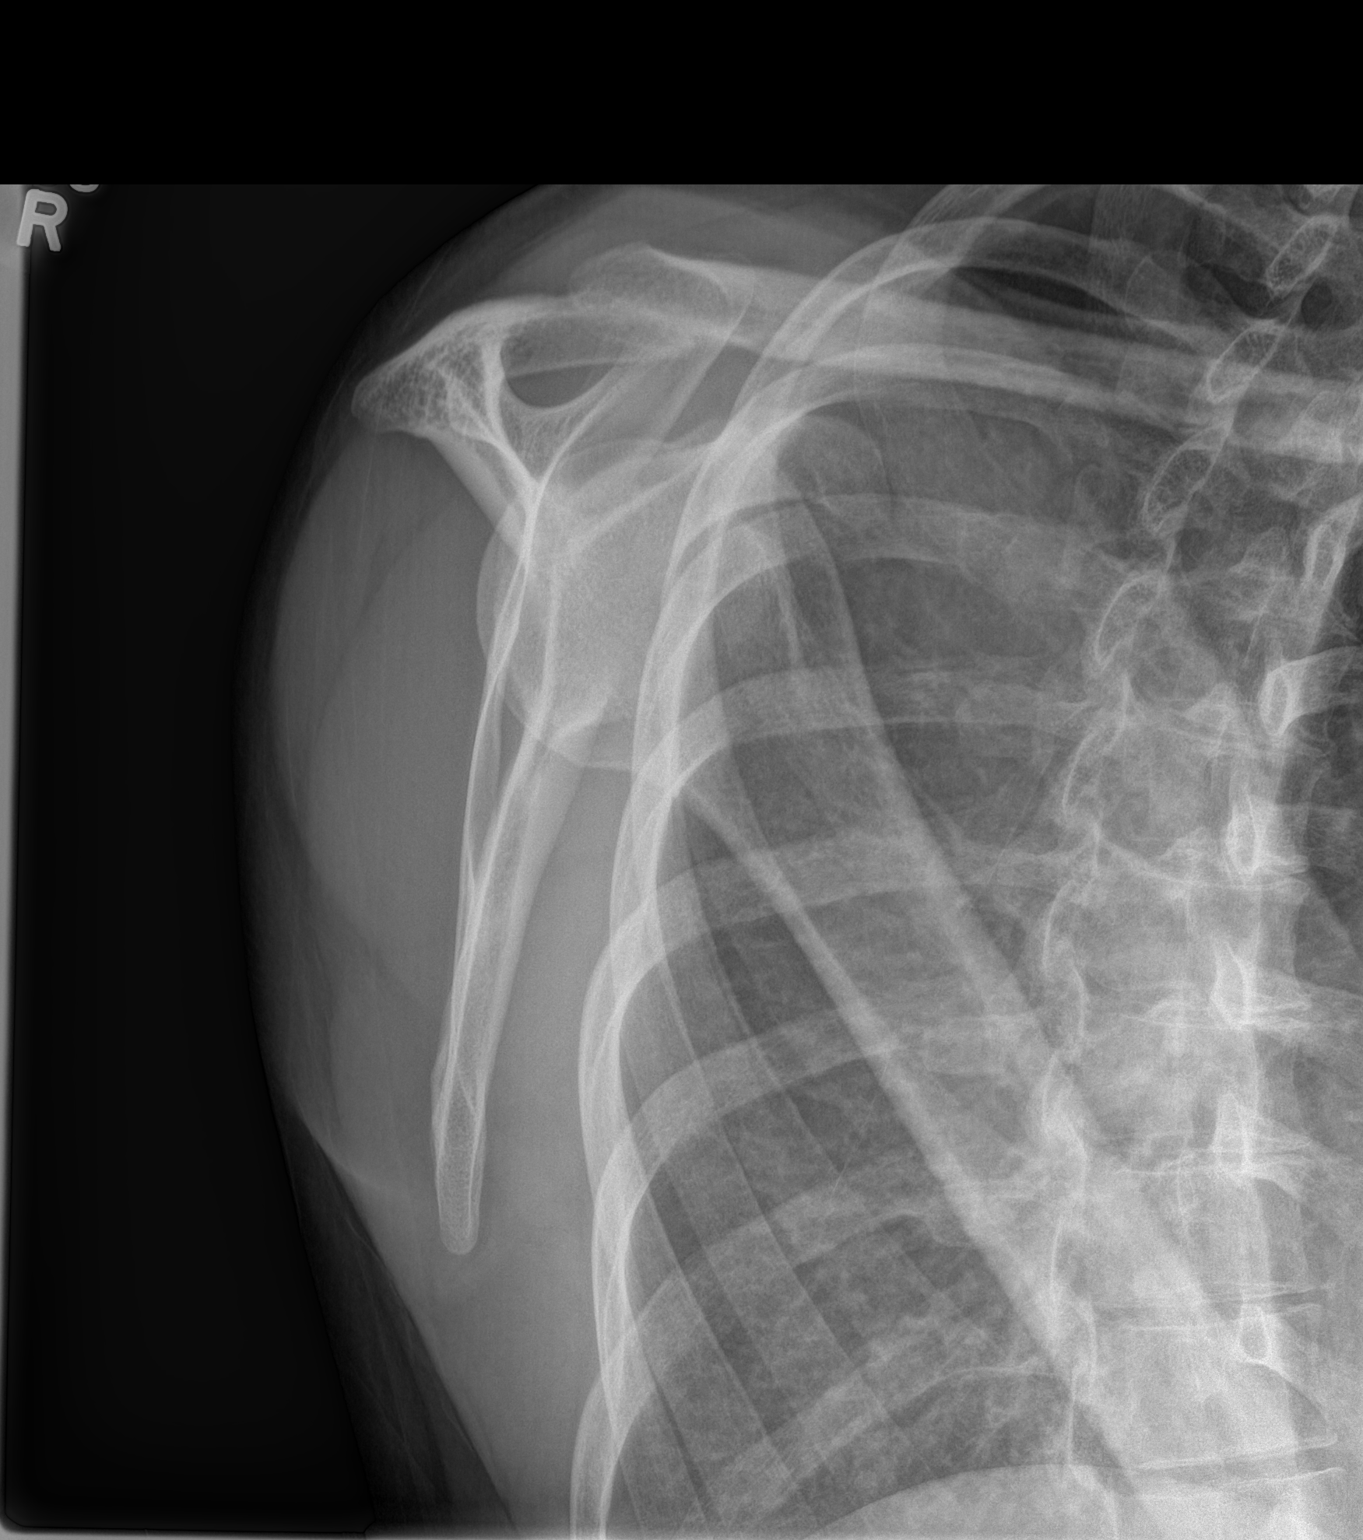
[im 3/3]
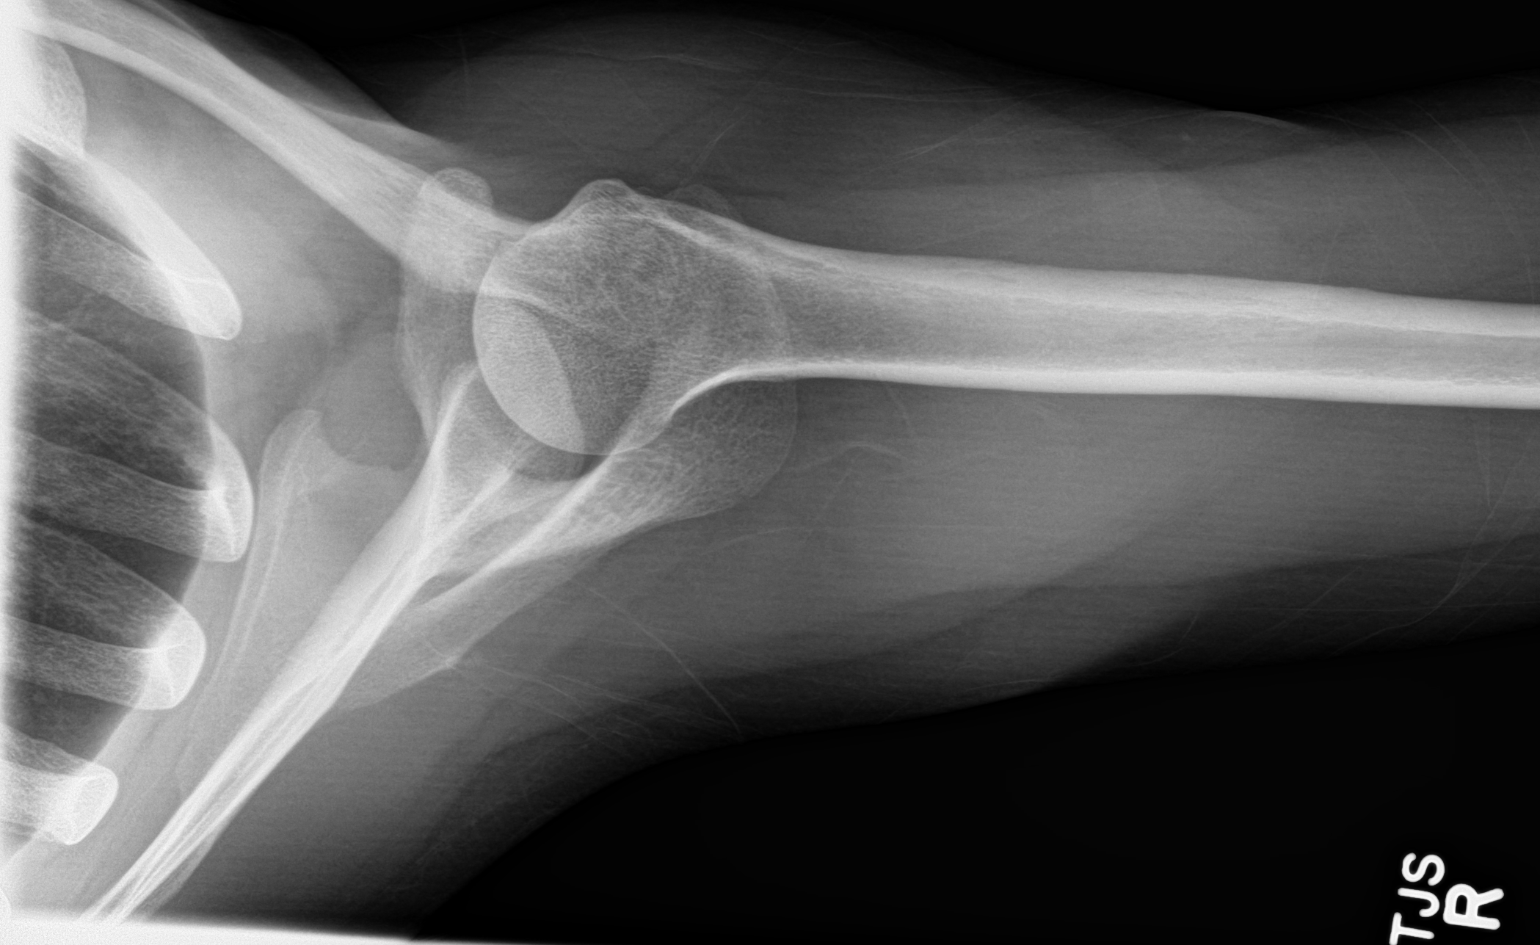

[3 of 3 positions shown; findings below may reference images not displayed]

FINDINGS: Mild degenerative changes of the acromioclavicular joint are seen.
No acute fracture or dislocation is noted. No soft tissue
abnormality is seen.
IMPRESSION: No acute abnormality noted.  Mild degenerative changes are seen.

## 2019-03-29 ENCOUNTER — Other Ambulatory Visit: Payer: Self-pay

## 2019-03-30 ENCOUNTER — Ambulatory Visit
Admission: RE | Admit: 2019-03-30 | Discharge: 2019-03-30 | Disposition: A | Discharge status: Home / Self Care | Payer: Self-pay | Source: Ambulatory Visit | Attending: Oncology | Admitting: Oncology

## 2019-03-30 ENCOUNTER — Ambulatory Visit: Payer: Self-pay | Attending: Oncology

## 2019-03-30 ENCOUNTER — Other Ambulatory Visit: Payer: Self-pay

## 2019-03-30 VITALS — BP 114/81 | HR 77 | Temp 98.3°F | Ht 65.0 in | Wt 146.0 lb

## 2019-03-30 DIAGNOSIS — Z0000 Encounter for general adult medical examination without abnormal findings: Secondary | ICD-10-CM | POA: Insufficient documentation

## 2019-03-30 NOTE — Progress Notes (Signed)
  Subjective:     Patient ID: Katie Lambert, female   DOB: Apr 08, 1981, 38 y.o.   MRN: [id number]  HPI   Review of Systems     Objective:   Physical Exam Chest:     Breasts:        Right: No swelling, bleeding, inverted nipple, mass, nipple discharge, skin change or tenderness.        Left: No swelling, bleeding, inverted nipple, mass, nipple discharge, skin change or tenderness.        Assessment:     38 year old patient presents for [facility] clinic visit.  No prior mammogram. Patient screened, and meets BCCCP eligibility.  Patient does not have insurance, Medicare or Medicaid.  Instructed patient on breast self awareness using teach back method.  Clinical breast exam unremarkable.  No mass or lump palpated.  Patient from [place], but most recently lived in [place] for 2 years.    Plan:     Sent for bilateral screening mammogram.

## 2019-04-04 NOTE — Progress Notes (Signed)
Letter mailed from [facility] to notify of normal mammogram results.  Patient to return in one year for annual screening.  Copy to [facility]. 

## 2019-11-02 DIAGNOSIS — I10 Essential (primary) hypertension: Secondary | ICD-10-CM | POA: Diagnosis not present

## 2019-11-02 DIAGNOSIS — N951 Menopausal and female climacteric states: Secondary | ICD-10-CM | POA: Diagnosis not present

## 2019-11-02 DIAGNOSIS — M8580 Other specified disorders of bone density and structure, unspecified site: Secondary | ICD-10-CM | POA: Diagnosis not present

## 2019-11-10 DIAGNOSIS — Z0000 Encounter for general adult medical examination without abnormal findings: Secondary | ICD-10-CM | POA: Diagnosis not present

## 2019-11-12 DIAGNOSIS — H524 Presbyopia: Secondary | ICD-10-CM | POA: Diagnosis not present

## 2020-05-10 ENCOUNTER — Ambulatory Visit: Payer: Self-pay

## 2020-05-10 DIAGNOSIS — Z23 Encounter for immunization: Secondary | ICD-10-CM

## 2021-03-27 ENCOUNTER — Other Ambulatory Visit: Payer: Self-pay | Admitting: Family Medicine

## 2021-03-27 DIAGNOSIS — Z1231 Encounter for screening mammogram for malignant neoplasm of breast: Secondary | ICD-10-CM

## 2021-04-24 ENCOUNTER — Ambulatory Visit: Payer: Self-pay

## 2021-07-02 ENCOUNTER — Ambulatory Visit
Admission: RE | Admit: 2021-07-02 | Discharge: 2021-07-02 | Disposition: A | Discharge status: Home / Self Care | Payer: BC Managed Care – PPO | Source: Ambulatory Visit | Attending: Family Medicine | Admitting: Family Medicine

## 2021-07-02 DIAGNOSIS — Z1231 Encounter for screening mammogram for malignant neoplasm of breast: Secondary | ICD-10-CM

## 2021-10-18 ENCOUNTER — Other Ambulatory Visit: Payer: Self-pay

## 2021-10-18 ENCOUNTER — Encounter: Payer: Self-pay | Admitting: Emergency Medicine

## 2021-10-18 ENCOUNTER — Ambulatory Visit
Admission: EM | Admit: 2021-10-18 | Discharge: 2021-10-18 | Disposition: A | Discharge status: Home / Self Care | Payer: BC Managed Care – PPO | Source: Home / Self Care

## 2021-10-18 ENCOUNTER — Emergency Department (HOSPITAL_COMMUNITY): Payer: BC Managed Care – PPO

## 2021-10-18 ENCOUNTER — Emergency Department (HOSPITAL_COMMUNITY)
Admission: EM | Admit: 2021-10-18 | Discharge: 2021-10-18 | Disposition: A | Discharge status: Home / Self Care | Payer: BC Managed Care – PPO | Attending: Emergency Medicine | Admitting: Emergency Medicine

## 2021-10-18 ENCOUNTER — Encounter (HOSPITAL_COMMUNITY): Payer: Self-pay | Admitting: Emergency Medicine

## 2021-10-18 DIAGNOSIS — I10 Essential (primary) hypertension: Secondary | ICD-10-CM | POA: Insufficient documentation

## 2021-10-18 DIAGNOSIS — E876 Hypokalemia: Secondary | ICD-10-CM | POA: Insufficient documentation

## 2021-10-18 DIAGNOSIS — R079 Chest pain, unspecified: Secondary | ICD-10-CM | POA: Insufficient documentation

## 2021-10-18 DIAGNOSIS — Z79899 Other long term (current) drug therapy: Secondary | ICD-10-CM | POA: Diagnosis not present

## 2021-10-18 DIAGNOSIS — R0789 Other chest pain: Secondary | ICD-10-CM

## 2021-10-18 LAB — CBC
HCT: 43 % (ref 36.0–46.0)
Hemoglobin: 14.2 g/dL (ref 12.0–15.0)
MCH: 31.8 pg (ref 26.0–34.0)
MCHC: 33 g/dL (ref 30.0–36.0)
MCV: 96.2 fL (ref 80.0–100.0)
Platelets: 264 10*3/uL (ref 150–400)
RBC: 4.47 MIL/uL (ref 3.87–5.11)
RDW: 11.9 % (ref 11.5–15.5)
WBC: 9.3 10*3/uL (ref 4.0–10.5)
nRBC: 0 % (ref 0.0–0.2)

## 2021-10-18 LAB — BASIC METABOLIC PANEL
Anion gap: 10 (ref 5–15)
BUN: 15 mg/dL (ref 6–20)
CO2: 27 mmol/L (ref 22–32)
Calcium: 10 mg/dL (ref 8.9–10.3)
Chloride: 101 mmol/L (ref 98–111)
Creatinine, Ser: 0.9 mg/dL (ref 0.44–1.00)
GFR, Estimated: >60 mL/min (ref >60)
Glucose, Bld: 87 mg/dL (ref 70–99)
Potassium: 3.4 mmol/L — ABNORMAL LOW (ref 3.5–5.1)
Sodium: 138 mmol/L (ref 135–145)

## 2021-10-18 LAB — TROPONIN I (HIGH SENSITIVITY)
Troponin I (High Sensitivity): 3 ng/L (ref <18)
Troponin I (High Sensitivity): <2 ng/L (ref <18)

## 2021-10-18 LAB — TSH: TSH: 0.53 u[IU]/mL (ref 0.350–4.500)

## 2021-10-18 MED ORDER — LIDOCAINE VISCOUS HCL 2 % MT SOLN
15.0000 mL | Freq: Once | OROMUCOSAL | Status: AC
  Administered 2021-10-18: 15 mL via ORAL
  Filled 2021-10-18: qty 15

## 2021-10-18 MED ORDER — ALUM & MAG HYDROXIDE-SIMETH 200-200-20 MG/5ML PO SUSP
30.0000 mL | Freq: Once | ORAL | Status: AC
  Administered 2021-10-18: 30 mL via ORAL
  Filled 2021-10-18: qty 30

## 2021-10-18 NOTE — ED Provider Notes (Signed)
?[facility] ? ? ? ?CSN: [id number] ?Arrival date & time: 10/18/21  1238 ? ? ?  ? ?History   ?Chief Complaint ?Chief Complaint  ?Patient presents with  ? Chest Pain  ? ? ?HPI ?Katie Lambert is a 41 y.o. female.  ? ?Patient presents for further evaluation after episode of chest discomfort that occurred today.  Patient reports that she had an episode of chest pain that lasted for approximately 20 minutes at around 11:15 AM today.  Chest discomfort was located in the center of the chest and she describes it as a "squeezing pain".  The pain radiated around to her back and up to her bilateral jaws.  Pain has now resolved.  She did have associated headache and shortness of breath as well.  Denies any dizziness, blurred vision, nausea, vomiting.  Patient does have history of hypertension but denies any cardiac history.  Denies history of the same. ? ? ?Chest Pain ? ?Past Medical History:  ?Diagnosis Date  ? Acid reflux   ? Arthritis   ? Dysrhythmia   ? HTN (hypertension)   ? ? ?Patient Active Problem List  ? Diagnosis Date Noted  ? Sprain of ankle, left 06/13/2016  ? Labral tear of shoulder, degenerative, right, with large anterior labral cyst 05/15/2016  ? Right cervical radiculopathy 05/15/2016  ? HTN (hypertension) 05/11/2012  ? Left ventricular hypertrophy 04/07/2012  ? ? ?Past Surgical History:  ?Procedure Laterality Date  ? ABDOMINAL HYSTERECTOMY    ? KNEE ARTHROSCOPY Left 03/08/2015  ? Procedure: ARTHROSCOPY KNEE;  Surgeon: Juanell Fairly, MD;  Location: [facility] ORS;  Service: Orthopedics;  Laterality: Left;  ? KNEE ARTHROSCOPY Left 07/13/2015  ? Procedure: ARTHROSCOPY KNEE, partial lateral menisetomy, chondroplasty lateral femoral chondyl;  Surgeon: Juanell Fairly, MD;  Location: [facility] ORS;  Service: Orthopedics;  Laterality: Left;  ? KNEE SURGERY Left   ? x 2  ? OOPHORECTOMY    ? TUBAL LIGATION    ? ? ?OB History   ?No obstetric history on file. ?  ? ? ? ?Home Medications   ? ?Prior to Admission  medications   ?Medication Sig Start Date End Date Taking? Authorizing Provider  ?hydrochlorothiazide (MICROZIDE) 12.5 MG capsule Take 12.5 mg by mouth daily.   Yes [provider]  ?lisinopril (PRINIVIL,ZESTRIL) 20 MG tablet Take 20 mg by mouth daily.   Yes [provider]  ?meloxicam (MOBIC) 7.5 MG tablet Take 7.5 mg by mouth daily.   Yes [provider]  ? ? ?Family History ?Family History  ?Problem Relation Age of Onset  ? Hypertension Father   ? Depression Mother   ? Hypertension Mother   ? Coronary artery disease Maternal Grandfather 50  ? ? ?Social History ?Social History  ? ?Tobacco Use  ? Smoking status: Every Day  ?  Packs/day: 0.50  ?  Years: 15.00  ?  Pack years: 7.50  ?  Types: Cigarettes  ? Smokeless tobacco: Never  ? Tobacco comments:  ?  Currently electronic cigarettes  ?Substance Use Topics  ? Alcohol use: Yes  ?  Comment: social  ? Drug use: No  ? ? ? ?Allergies   ?Patient has no known allergies. ? ? ?Review of Systems ?Review of Systems ?Per HPI ? ?Physical Exam ?Triage Vital Signs ?ED Triage Vitals  ?Enc Vitals Group  ?   BP 10/18/21 1341 101/68  ?   Pulse Rate 10/18/21 1341 86  ?   Resp 10/18/21 1341 18  ?   Temp  10/18/21 1341 98 ?F (36.7 ?C)  ?   Temp Source 10/18/21 1341 Oral  ?   SpO2 10/18/21 1341 98 %  ?   Weight 10/18/21 1343 155 lb (70.3 kg)  ?   Height 10/18/21 1343 5' 4.5" (1.638 m)  ?   Head Circumference --   ?   Peak Flow --   ?   Pain Score 10/18/21 1343 0  ?   Pain Loc --   ?   Pain Edu? --   ?   Excl. in [place]? --   ? ?No data found. ? ?Updated Vital Signs ?BP 101/68 (BP Location: Right Arm)   Pulse 86   Temp 98 ?F (36.7 ?C) (Oral)   Resp 18   Ht 5' 4.5" (1.638 m)   Wt 155 lb (70.3 kg)   SpO2 98%   BMI 26.19 kg/m?  ? ?Visual Acuity ?Right Eye Distance:   ?Left Eye Distance:   ?Bilateral Distance:   ? ?Right Eye Near:   ?Left Eye Near:    ?Bilateral Near:    ? ?Physical Exam ?Constitutional:   ?   General: She is not in acute distress. ?   Appearance:  Normal appearance. She is not toxic-appearing or diaphoretic.  ?HENT:  ?   Head: Normocephalic and atraumatic.  ?Eyes:  ?   Extraocular Movements: Extraocular movements intact.  ?   Conjunctiva/sclera: Conjunctivae normal.  ?   Pupils: Pupils are equal, round, and reactive to light.  ?Cardiovascular:  ?   Rate and Rhythm: Normal rate and regular rhythm.  ?   Pulses: Normal pulses.  ?   Heart sounds: Normal heart sounds.  ?Pulmonary:  ?   Effort: Pulmonary effort is normal. No respiratory distress.  ?   Breath sounds: Normal breath sounds.  ?Neurological:  ?   General: No focal deficit present.  ?   Mental Status: She is alert and oriented to person, place, and time. Mental status is at baseline.  ?   Cranial Nerves: Cranial nerves 2-12 are intact.  ?   Sensory: Sensation is intact.  ?   Motor: Motor function is intact.  ?   Coordination: Coordination is intact.  ?   Gait: Gait is intact.  ?Psychiatric:     ?   Mood and Affect: Mood normal.     ?   Behavior: Behavior normal.     ?   Thought Content: Thought content normal.     ?   Judgment: Judgment normal.  ? ? ? ?UC Treatments / Results  ?Labs ?(all labs ordered are listed, but only abnormal results are displayed) ?Labs Reviewed - No data to display ? ?EKG ? ? ?Radiology ?No results found. ? ?Procedures ?Procedures (including critical care time) ? ?Medications Ordered in UC ?Medications - No data to display ? ?Initial Impression / Assessment and Plan / UC Course  ?I have reviewed the triage vital signs and the nursing notes. ? ?Pertinent labs & imaging results that were available during my care of the patient were reviewed by me and considered in my medical decision making (see chart for details). ? ?  ? ?EKG was normal sinus rhythm.  Prior EKGs appear to be the same.  Do think the patient needs a more extensive evaluation than can be provided at the urgent care given description of chest pain and risk factor of hypertension.  Advised patient that she will need to  go to the emergency department for this.  Do not  think the EMS transport is necessary given normal EKG and that pain has resolved.  Patient was agreeable to going to the ED and left via her husband transporting her to the hospital. ?Final Clinical Impressions(s) / UC Diagnoses  ? ?Final diagnoses:  ?Other chest pain  ? ? ? ?Discharge Instructions   ? ?  ?Please go to the ER as soon as you leave urgent care for further evaluation and management. ? ? ? ?ED Prescriptions   ?None ?  ? ?PDMP not reviewed this encounter. ?  ?Teodora Medici, Ken Caryl ?10/18/21 1429 ? ?

## 2021-10-18 NOTE — ED Triage Notes (Signed)
Patient c/o chest pain that started today, centralized that radiated to her back.  The pain felt like more of squeezing sensitation.   Patient does have a history of HTN.  Some SOB as well. ?

## 2021-10-18 NOTE — Discharge Instructions (Addendum)
We have been evaluated for chest pain.  Your work-up is grossly unremarkable.  Your chest x-ray did not show anything acute. ? ?Please follow-up with your primary care provider. ? ?Return to the ED for worsening symptoms.  ?

## 2021-10-18 NOTE — ED Notes (Addendum)
Patient is being discharged from the Urgent Care and sent to the Emergency Department via pov with husband driving . Per [place] np, patient is in need of higher level of care due to cp and ekg findings. Patient is aware and verbalizes understanding of plan of care.  ?Vitals:  ? 10/18/21 1341  ?BP: 101/68  ?Pulse: 86  ?Resp: 18  ?Temp: 98 ?F (36.7 ?C)  ?SpO2: 98%  ?  ?

## 2021-10-18 NOTE — ED Provider Triage Note (Signed)
DeniesEmergency Medicine Provider Triage Evaluation Note ? ?Katie Lambert , a 41 y.o. female  was evaluated in triage.  Pt complains of chest squeezing sensation that radiated to the neck, back that started this morning around 11, lasted for around 20 minutes, the severity of around out of 10. Denies exertion at the time. Patient reports that she felt short of breath, labored breathing at the time.  She reports that it spontaneously resolved without aspirin or nitroglycerin.  Since then she has had intermittent tightness, fullness sensation in the chest, "like taking a shot of alcohol".  She reports that she does have a history of acid reflux but this feels very dissimilar.  She endorses family history of ACS, CAD, reports light tobacco use at 2 to 4 cigarettes/day, and has high blood pressure.  She denies history of high cholesterol, previous stroke, previous ACS, diabetes. Reports some tingling in bilateral wrists at this time. ? ?Review of Systems  ?Positive: Chest pain, shob ?Negative: Nausea, vomiting ? ?Physical Exam  ?BP 115/83 (BP Location: Left Arm)   Pulse 90   Temp 98.9 ?F (37.2 ?C) (Oral)   Resp 16   Ht 5' 4.5" (1.638 m)   Wt 70.3 kg   SpO2 100%   BMI 26.20 kg/m?  ?Gen:   Awake, no distress   ?Resp:  Normal effort  ?MSK:   Moves extremities without difficulty  ?Other:   ? ?Medical Decision Making  ?Medically screening exam initiated at 2:43 PM.  Appropriate orders placed.  Aaron Azzure Garabedian was informed that the remainder of the evaluation will be completed by another provider, this initial triage assessment does not replace that evaluation, and the importance of remaining in the ED until their evaluation is complete. ? ?Workup initiated ?  ?Olene Floss, PA-C ?10/18/21 1445 ? ?

## 2021-10-18 NOTE — ED Provider Notes (Signed)
?[facility] ?Provider Note ? ? ?CSN: [id number] ?Arrival date & time: 10/18/21  1427 ? ?  ? ?History ? ?Chief Complaint  ?Patient presents with  ? Chest Pain  ? ? ?Katie Lambert is a 41 y.o. female. ? ? ?Chest Pain ? ?Patient is a 41 year old female with a history of GERD, hypertension who presents to the emergency department for acute chest pain that lasted 20 minutes.  She described the chest pain as pressure-like and radiating to her jaw and back.  She denies any similar symptoms in the past.  She tried an acid reflux medication thinking this was her GERD.  However her symptoms did not improved at which point she went to urgent care.  She was sent from urgent care for further evaluation.  She denies associated shortness of breath.  Denies cough, congestion or fever.  Denies any recent sickness.  Denies any urinary symptoms or fatigue.  She does report being compliant with her medication.  She does smoke every day and drinks occasionally.  Denies recreational drug use including cocaine.  Otherwise no other complaints. ? ?Home Medications ?Prior to Admission medications   ?Medication Sig Start Date End Date Taking? Authorizing Provider  ?hydrochlorothiazide (MICROZIDE) 12.5 MG capsule Take 12.5 mg by mouth daily.    [provider]  ?lisinopril (PRINIVIL,ZESTRIL) 20 MG tablet Take 20 mg by mouth daily.    [provider]  ?meloxicam (MOBIC) 7.5 MG tablet Take 7.5 mg by mouth daily.    [provider]  ?   ? ?Allergies    ?Patient has no known allergies.   ? ?Review of Systems   ?Review of Systems  ?Cardiovascular:  Positive for chest pain.  ? ?Physical Exam ?Updated Vital Signs ?BP 108/72   Pulse 74   Temp 98.4 ?F (36.9 ?C) (Oral)   Resp 17   Ht 5\' 4"  (1.626 m)   Wt 70.3 kg   SpO2 93%   BMI 26.61 kg/m?  ?Physical Exam ?Constitutional:   ?   General: She is not in acute distress. ?   Appearance: She is not ill-appearing.  ?HENT:  ?    Head: Normocephalic.  ?Eyes:  ?   Pupils: Pupils are equal, round, and reactive to light.  ?Cardiovascular:  ?   Rate and Rhythm: Normal rate.  ?Pulmonary:  ?   Effort: No respiratory distress.  ?   Breath sounds: No wheezing.  ?Chest:  ?   Chest wall: No tenderness.  ?Abdominal:  ?   Palpations: Abdomen is soft.  ?   Tenderness: There is no guarding or rebound.  ?Musculoskeletal:  ?   Cervical back: Normal range of motion.  ?   Right lower leg: No edema.  ?   Left lower leg: No edema.  ?Skin: ?   General: Skin is warm.  ?   Capillary Refill: Capillary refill takes less than 2 seconds.  ?   Coloration: Skin is not cyanotic.  ?Neurological:  ?   General: No focal deficit present.  ?   Mental Status: She is alert and oriented to person, place, and time.  ?Psychiatric:     ?   Mood and Affect: Mood normal.  ? ? ?ED Results / Procedures / Treatments   ?Labs ?(all labs ordered are listed, but only abnormal results are displayed) ?Labs Reviewed  ?BASIC METABOLIC PANEL - Abnormal; Notable for the following components:  ?    Result Value  ? Potassium 3.4 (*)   ?  All other components within normal limits  ?CBC  ?TSH  ?TROPONIN I (HIGH SENSITIVITY)  ?TROPONIN I (HIGH SENSITIVITY)  ? ? ?EKG ?None ? ?Radiology ?DG Chest 2 View ? ?Result Date: 10/18/2021 ?CLINICAL DATA:  Chest pain EXAM: CHEST - 2 VIEW COMPARISON:  None. FINDINGS: Normal mediastinum and cardiac silhouette. Normal pulmonary vasculature. No evidence of effusion, infiltrate, or pneumothorax. No acute bony abnormality. IMPRESSION: No acute cardiopulmonary process. Electronically Signed   By: Suzy Bouchard M.D.   On: 10/18/2021 15:13   ? ?Procedures ?Procedures  ? ?Medications Ordered in ED ?Medications  ?alum & mag hydroxide-simeth (MAALOX/MYLANTA) 200-200-20 MG/5ML suspension 30 mL (30 mLs Oral Given 10/18/21 1644)  ?  And  ?lidocaine (XYLOCAINE) 2 % viscous mouth solution 15 mL (15 mLs Oral Given 10/18/21 1645)  ? ? ?ED Course/ Medical Decision Making/ A&P ?   ?HEAR Score: 2                       ?Medical Decision Making ?Problems Addressed: ?Chest pain, unspecified type: acute illness or injury that poses a threat to life or bodily functions ? ?Amount and/or Complexity of Data Reviewed ?External Data Reviewed: notes. ?   Details: Previous history has been reviewed ?Labs: ordered. Decision-making details documented in ED Course. ?Radiology: ordered and independent interpretation performed. Decision-making details documented in ED Course. ?ECG/medicine tests: ordered and independent interpretation performed. Decision-making details documented in ED Course. ? ?Risk ?OTC drugs. ?Prescription drug management. ? ? ?Patient is a 41 year old female with history listed above presents to the emergency department due to concern for left-sided chest pain.  Patient vital signs are within the reference range.  Physical exam without sign of respiratory distress.  No focal deficit.  No acute abdomen. ? ?Patient presentation most likely benign chest pain versus musculoskeletal versus GI in etiology given her history of GERD.  I have considered ACS but less likely (EKG with normal sinus rhythm, delta troponin is negative. I have considered pneumonia however less likely based on chest x-ray showing no acute cardiopulmonary etiology.  No pneumonia on chest x-ray.  In addition no pneumothorax on chest x-ray.  Less concern for anemia as patient hemoglobin adequate are stable.  In addition less concern for metabolic derangement.  Patient CMP grossly unremarkable except for mild hypokalemia.  Less concern for thyroid related chest pain.  Patient TSH unremarkable. ? ?Intervention: Given patient a GI cocktail including Maalox and viscous lidocaine. ? ?On reassessment, patient reports symptom improvement.  She currently does not have any chest pain.  She was able to have a without acute abnormality.  She remained hemodynamically stable, alert and oriented.  I believe patient is stable for  discharge.  Strict return precaution has been discussed.  Recommend follow-up with PCP in 1 to 2 days.  Patient and her husband understand and agrees with plan. ? ?Final Clinical Impression(s) / ED Diagnoses ?Final diagnoses:  ?Chest pain, unspecified type  ? ? ?Rx / DC Orders ?ED Discharge Orders   ? ? None  ? ?  ? ? ?  ?Donnamarie Poag, MD ?10/18/21 1815 ? ?  ?Carmin Muskrat, MD ?10/18/21 2336 ? ?

## 2021-10-18 NOTE — ED Triage Notes (Signed)
Pt c/o centralized chest pain radiating to back and jaw, ShOB, and HA. States "it feels like it is squeezing in my chest".  ?Hx: HTN ?

## 2021-10-18 NOTE — Discharge Instructions (Signed)
Please go to the ER as soon as you leave urgent care for further evaluation and management.  

## 2022-08-04 IMAGING — MG MM DIGITAL SCREENING BILAT W/ TOMO AND CAD
6 of 10 series · 6 of 30 positions shown · non-contrast
Comparison: Previous exam(s).

CLINICAL DATA: Screening.

EXAM:
DIGITAL SCREENING BILATERAL MAMMOGRAM WITH TOMOSYNTHESIS AND CAD
TECHNIQUE: Bilateral screening digital craniocaudal and mediolateral oblique
mammograms were obtained. Bilateral screening digital breast
tomosynthesis was performed. The images were evaluated with
computer-aided detection.

[L MLO synth-2D]
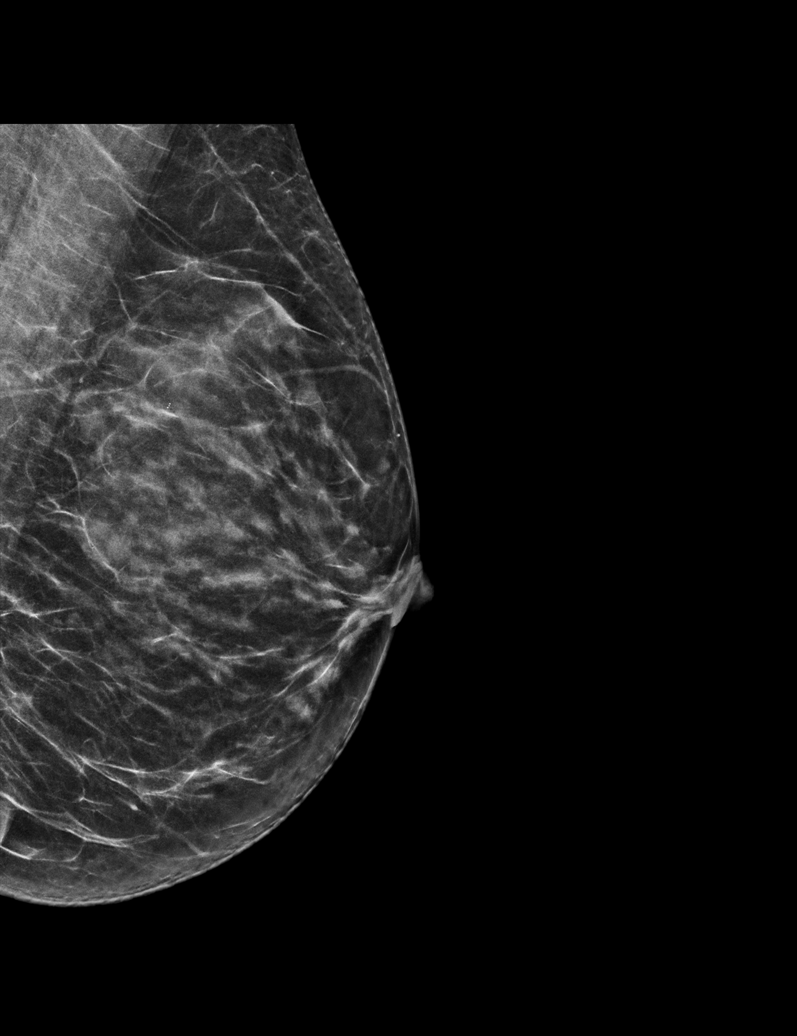

[L CC synth-2D]
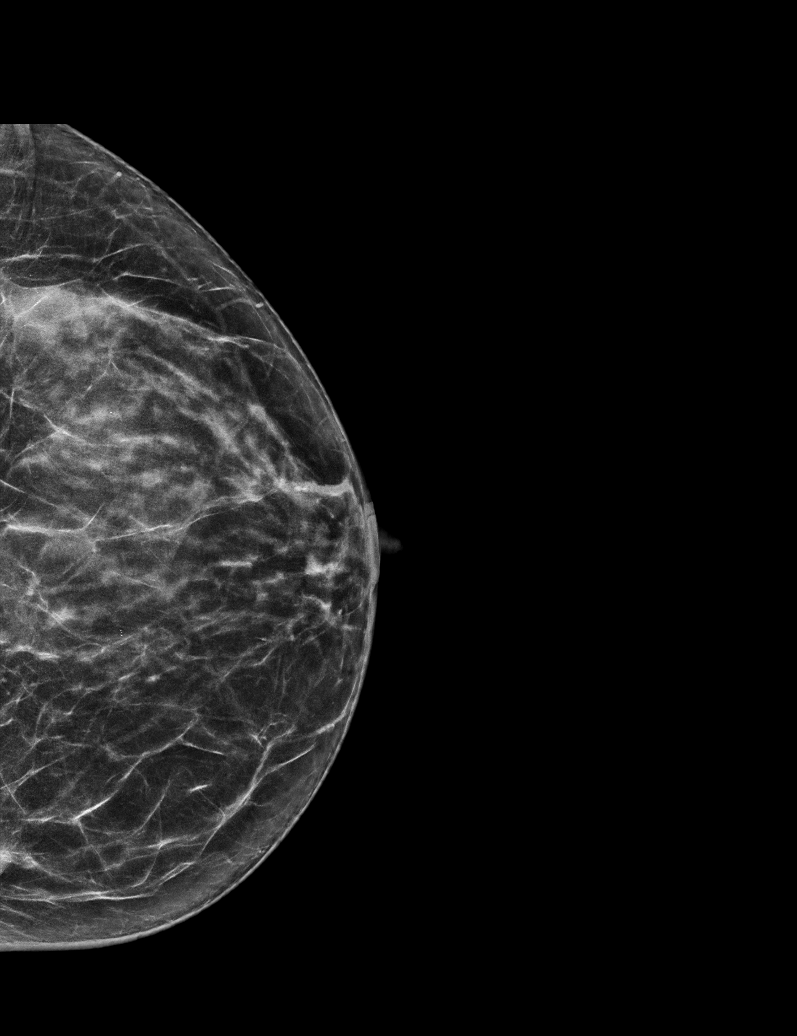

[R CC synth-2D]
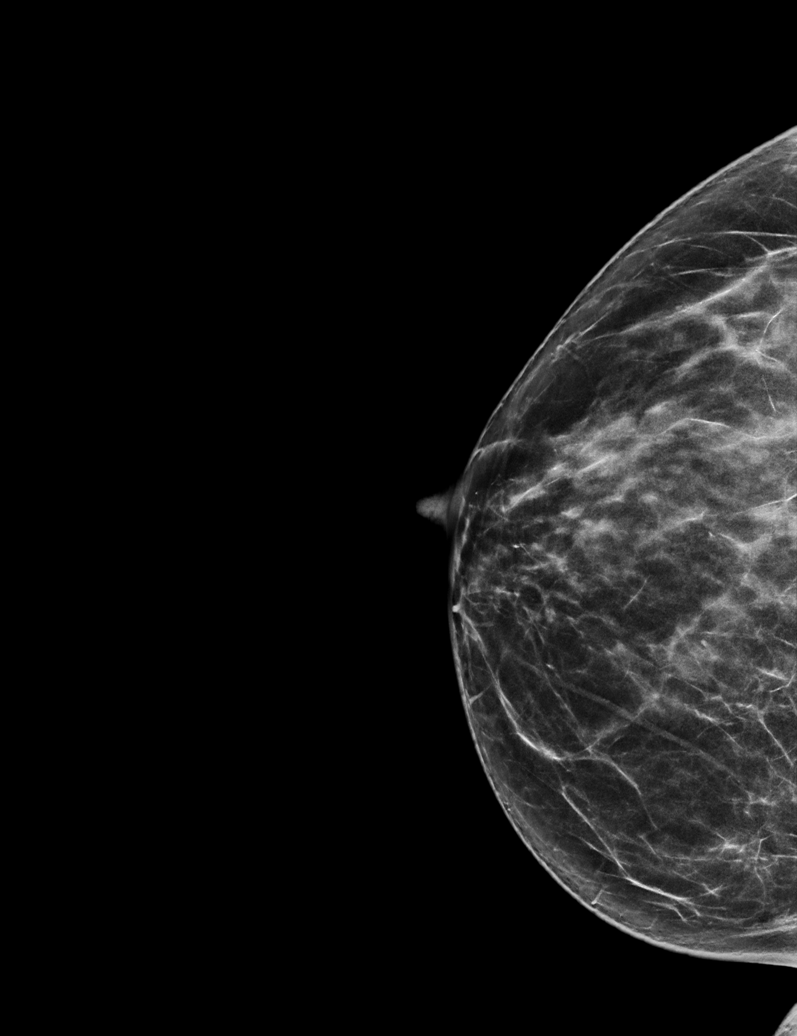

[R MLO synth-2D]
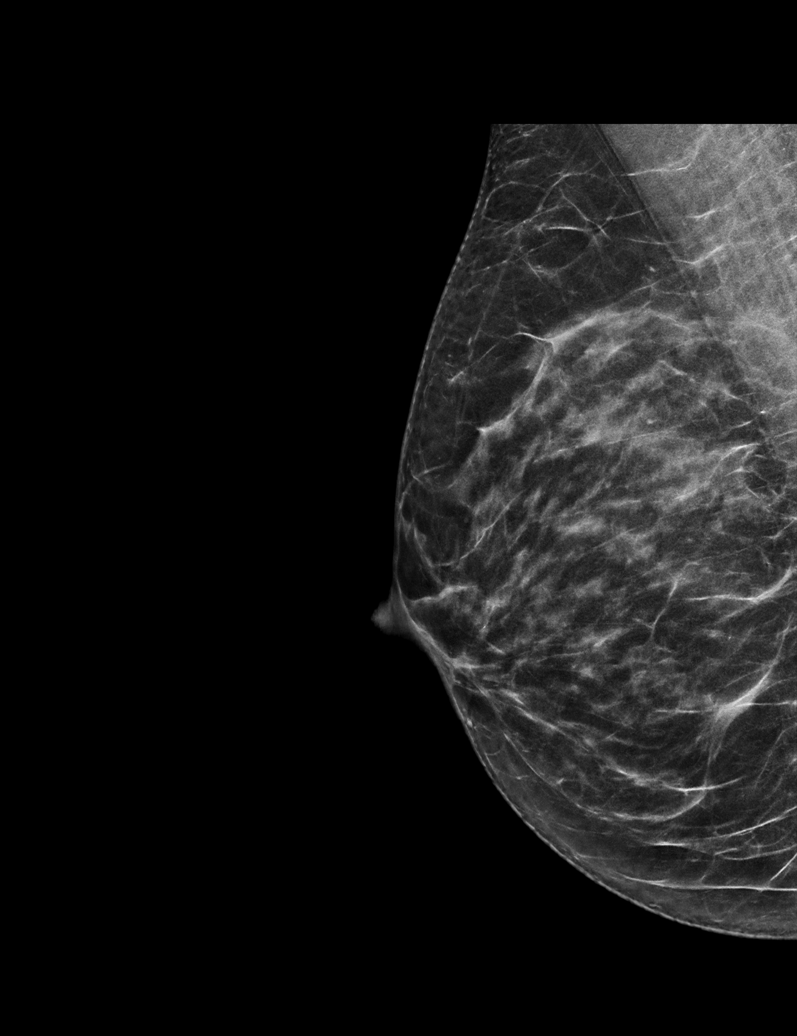

[R XCCL synth-2D]
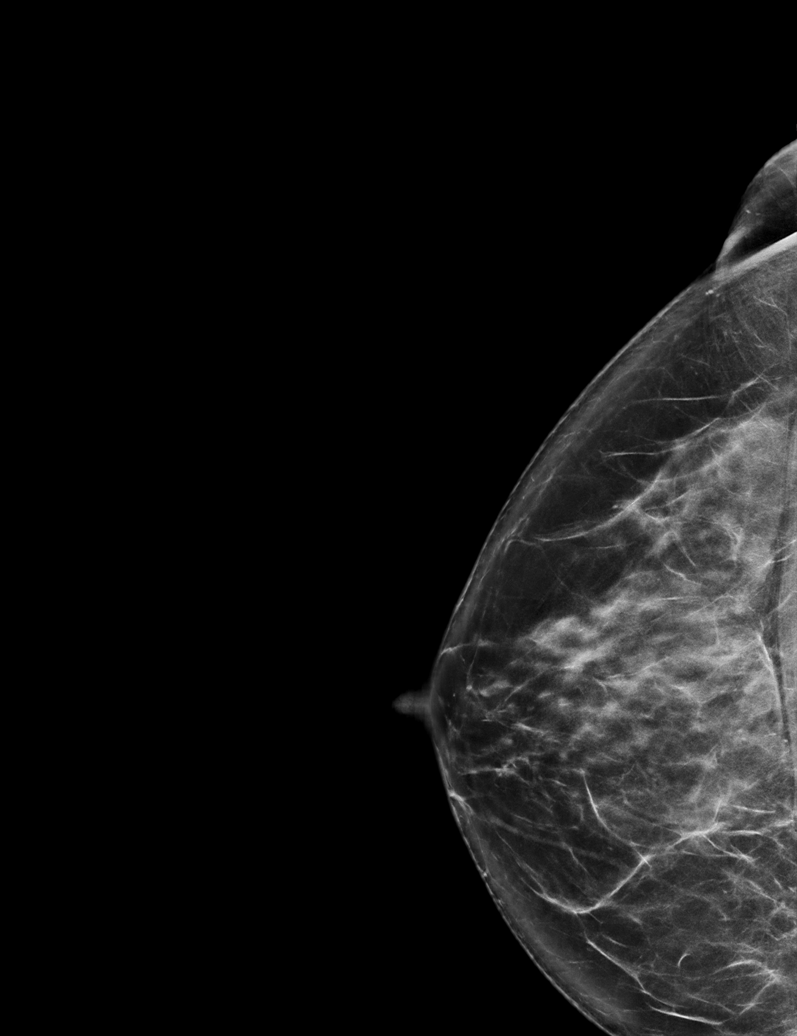

[R XCCL tomo · tomo slice 36/71.0]
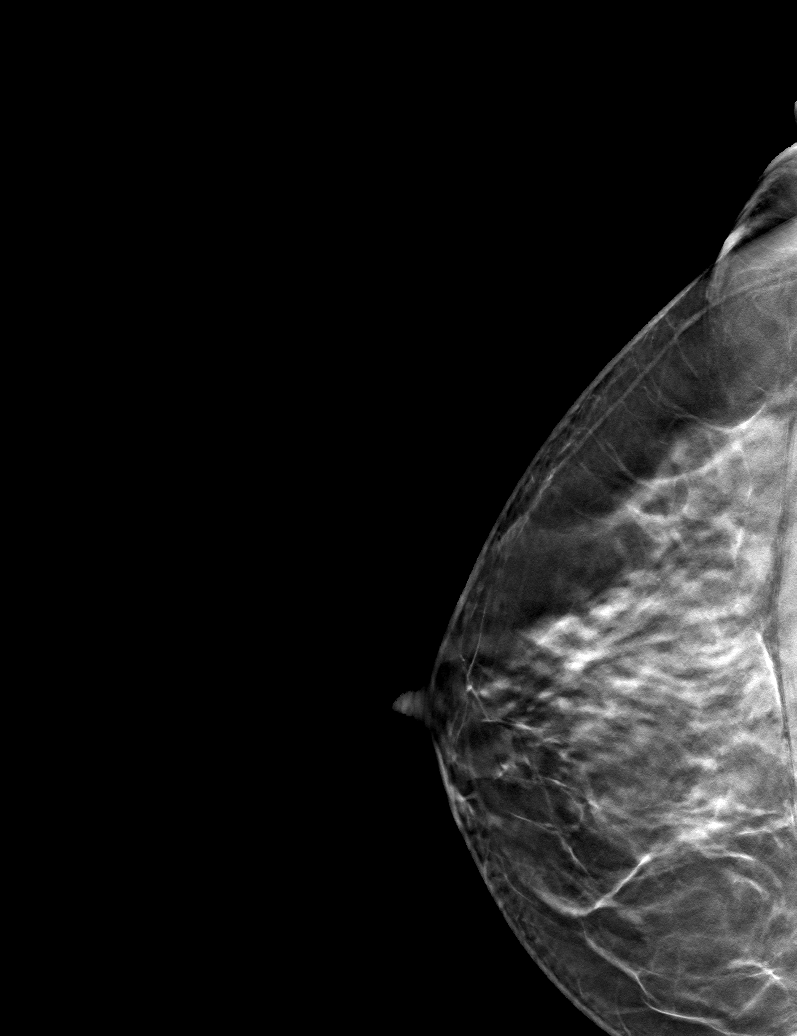

[6 of 30 positions shown; findings below may reference images not displayed]

ACR Breast Density Category c: The breast tissue is heterogeneously
dense, which may obscure small masses.
FINDINGS: There are no findings suspicious for malignancy.
IMPRESSION: No mammographic evidence of malignancy. A result letter of this
screening mammogram will be mailed directly to the patient.

RECOMMENDATION:
Screening mammogram in one year. (Code:Q3-W-BC3)

BI-RADS CATEGORY  1: Negative.

## 2022-11-20 IMAGING — CR DG CHEST 2V
2 series · 2 of 2 positions shown · non-contrast
Comparison: None.

CLINICAL DATA: Chest pain

EXAM:
CHEST - 2 VIEW

[chest lat]
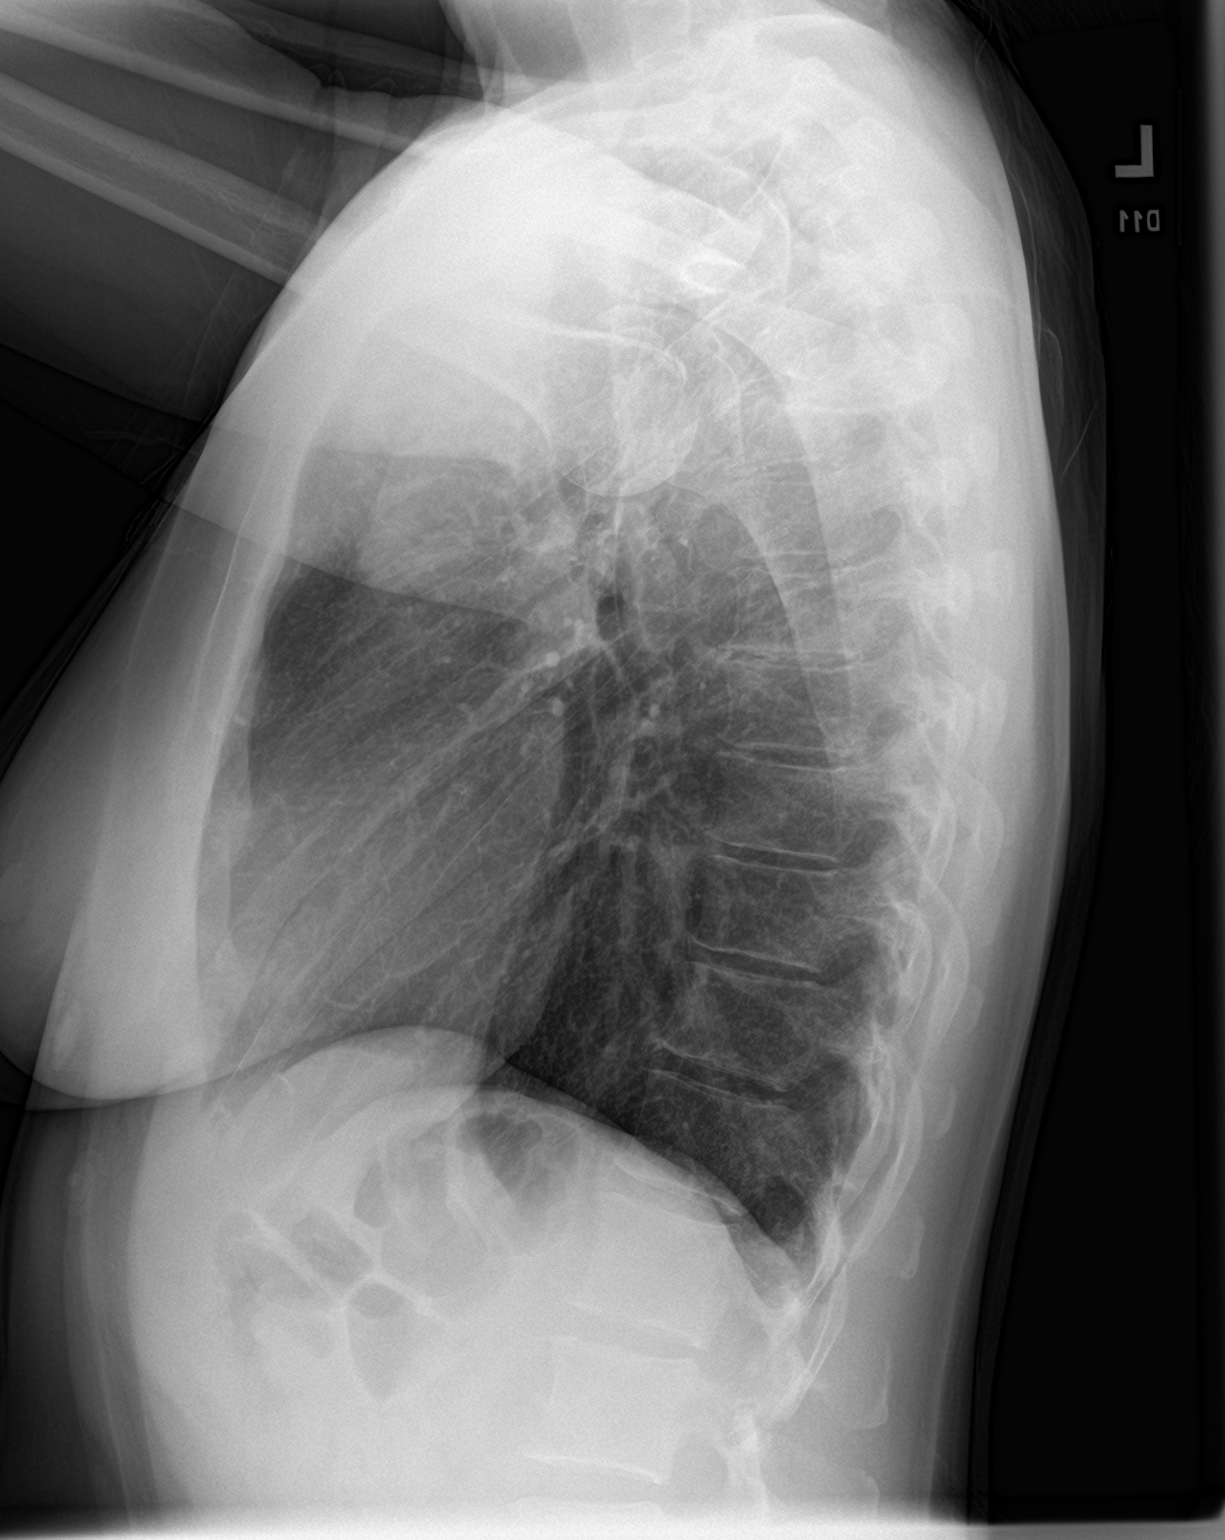

[chest ap]
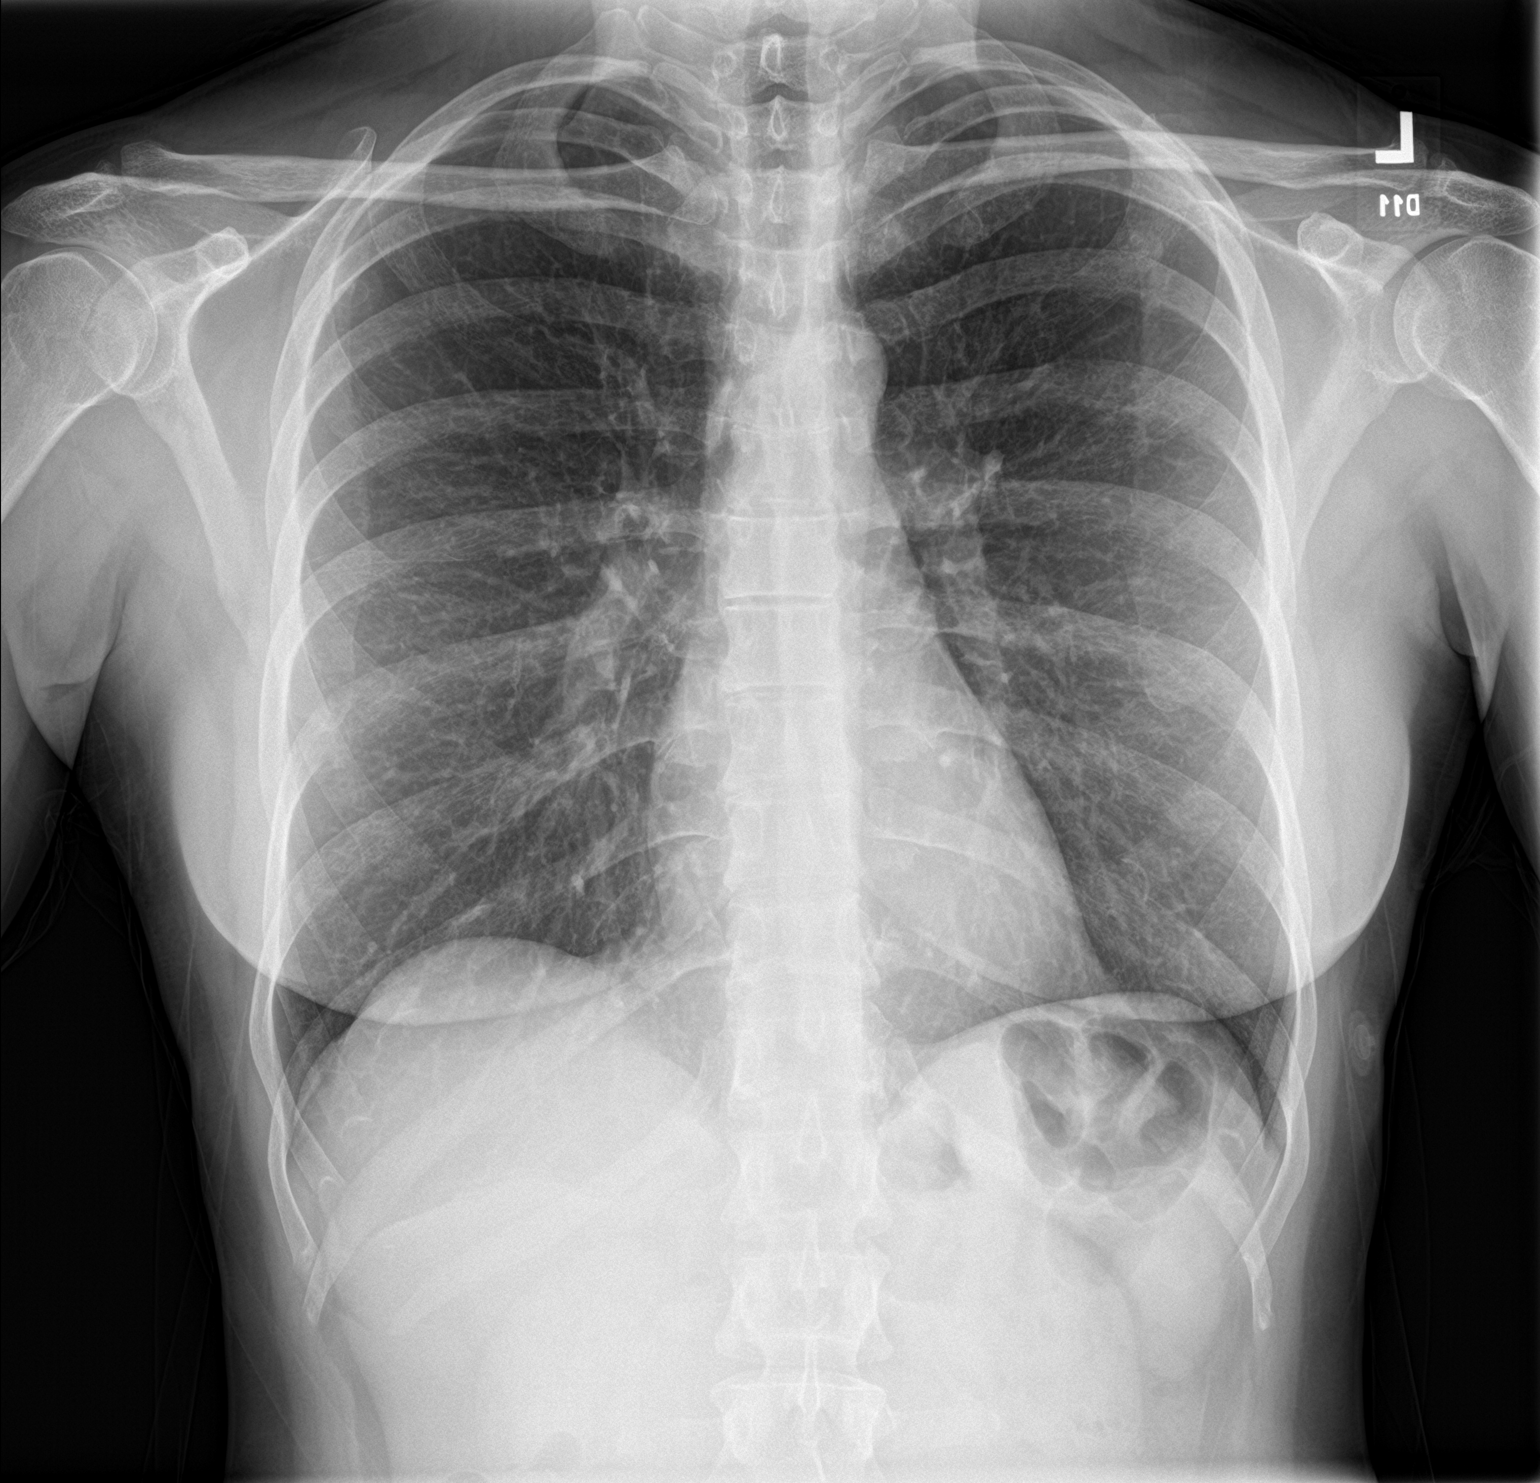

[2 of 2 positions shown; findings below may reference images not displayed]

FINDINGS: Normal mediastinum and cardiac silhouette. Normal pulmonary
vasculature. No evidence of effusion, infiltrate, or pneumothorax.
No acute bony abnormality.
IMPRESSION: No acute cardiopulmonary process.

## 2023-03-04 ENCOUNTER — Other Ambulatory Visit: Payer: Self-pay | Admitting: Family Medicine

## 2023-03-04 ENCOUNTER — Encounter: Payer: Self-pay | Admitting: Family Medicine

## 2023-03-04 ENCOUNTER — Ambulatory Visit
Admission: RE | Admit: 2023-03-04 | Discharge: 2023-03-04 | Disposition: A | Discharge status: Home / Self Care | Payer: BC Managed Care – PPO | Source: Ambulatory Visit | Attending: Family Medicine | Admitting: Family Medicine

## 2023-03-04 DIAGNOSIS — M79669 Pain in unspecified lower leg: Secondary | ICD-10-CM | POA: Insufficient documentation

## 2023-03-11 ENCOUNTER — Encounter: Payer: Self-pay | Admitting: Nurse Practitioner

## 2023-03-11 ENCOUNTER — Other Ambulatory Visit: Payer: Self-pay | Admitting: Family Medicine

## 2023-03-11 DIAGNOSIS — Z1231 Encounter for screening mammogram for malignant neoplasm of breast: Secondary | ICD-10-CM

## 2023-03-26 ENCOUNTER — Ambulatory Visit
Admission: RE | Admit: 2023-03-26 | Discharge: 2023-03-26 | Disposition: A | Discharge status: Home / Self Care | Payer: BC Managed Care – PPO | Source: Ambulatory Visit | Attending: Family Medicine | Admitting: Family Medicine

## 2023-03-26 DIAGNOSIS — Z1231 Encounter for screening mammogram for malignant neoplasm of breast: Secondary | ICD-10-CM

## 2023-06-01 NOTE — Progress Notes (Unsigned)
Brief Narrative 42 y.o. yo female with a past medical history not limited to referred by PCP    ASSESSMENT      See PMH for any additional medical history   PLAN     HPI   Chief complaint :          Procedure risk assessment:  No history of CHF.  No supplemental 02 use at home.  Not a known difficult airway Anticoagulant:    GI History / Studies   **May not be a complete list of studies     Labs      Latest Ref Rng & Units 10/18/2021    2:45 PM 01/21/2016   11:10 AM 10/27/2015   10:27 AM  CBC  WBC 4.0 - 10.5 K/uL 9.3  6.4  5.5   Hemoglobin 12.0 - 15.0 g/dL 16.1  09.6  04.5   Hematocrit 36.0 - 46.0 % 43.0  41.0  38.2   Platelets 150 - 400 K/uL 264  193  190     Lab Results  Component Value Date   LIPASE 15 10/27/2015      Latest Ref Rng & Units 10/18/2021    2:45 PM 01/21/2016   11:10 AM 10/27/2015   10:27 AM  CMP  Glucose 70 - 99 mg/dL 87  97  90   BUN 6 - 20 mg/dL 15  21  15    Creatinine 0.44 - 1.00 mg/dL 4.09  8.11  9.14   Sodium 135 - 145 mmol/L 138  139  139   Potassium 3.5 - 5.1 mmol/L 3.4  3.8  3.6   Chloride 98 - 111 mmol/L 101  106  104   CO2 22 - 32 mmol/L 27  26  26    Calcium 8.9 - 10.3 mg/dL 78.2  9.4  9.5   Total Protein 6.5 - 8.1 g/dL   7.0   Total Bilirubin 0.3 - 1.2 mg/dL   0.6   Alkaline Phos 38 - 126 U/L   65   AST 15 - 41 U/L   20   ALT 14 - 54 U/L   19         MM 3D SCREENING MAMMOGRAM BILATERAL BREAST CLINICAL DATA:  Screening.  EXAM: DIGITAL SCREENING BILATERAL MAMMOGRAM WITH TOMOSYNTHESIS AND CAD  TECHNIQUE: Bilateral screening digital craniocaudal and mediolateral oblique mammograms were obtained. Bilateral screening digital breast tomosynthesis was performed. The images were evaluated with computer-aided detection.  COMPARISON:  Previous exam(s).  ACR Breast Density Category c: The breasts are heterogeneously dense, which may obscure small masses.  FINDINGS: There are no findings suspicious for  malignancy.  IMPRESSION: No mammographic evidence of malignancy. A result letter of this screening mammogram will be mailed directly to the patient.  RECOMMENDATION: Screening mammogram in one year. (Code:SM-B-01Y)  BI-RADS CATEGORY  1: Negative.  Electronically Signed   By: Norva Pavlov M.D.   On: 03/27/2023 14:39    Past Medical History:  Diagnosis Date   Acid reflux    Arthritis    Dysrhythmia    HTN (hypertension)    Past Surgical History:  Procedure Laterality Date   ABDOMINAL HYSTERECTOMY     KNEE ARTHROSCOPY Left 03/08/2015   Procedure: ARTHROSCOPY KNEE;  Surgeon: Juanell Fairly, MD;  Location: [facility];  Service: Orthopedics;  Laterality: Left;   KNEE ARTHROSCOPY Left 07/13/2015   Procedure: ARTHROSCOPY KNEE, partial lateral menisetomy, chondroplasty lateral femoral chondyl;  Surgeon: Juanell Fairly, MD;  Location: [facility];  Service:  Orthopedics;  Laterality: Left;   KNEE SURGERY Left    x 2   OOPHORECTOMY     TUBAL LIGATION     Family History  Problem Relation Age of Onset   Hypertension Father    Depression Mother    Hypertension Mother    Coronary artery disease Maternal Grandfather 24   Social History   Tobacco Use   Smoking status: Every Day    Current packs/day: 0.50    Average packs/day: 0.5 packs/day for 15.0 years (7.5 ttl pk-yrs)    Types: Cigarettes   Smokeless tobacco: Never   Tobacco comments:    Currently electronic cigarettes  Substance Use Topics   Alcohol use: Yes    Comment: social   Drug use: No   Current Outpatient Medications  Medication Sig Dispense Refill   hydrochlorothiazide (MICROZIDE) 12.5 MG capsule Take 12.5 mg by mouth daily.     lisinopril (PRINIVIL,ZESTRIL) 20 MG tablet Take 20 mg by mouth daily.     meloxicam (MOBIC) 7.5 MG tablet Take 7.5 mg by mouth daily.     No current facility-administered medications for this visit.   No Known Allergies   Review of Systems: Positive for ***.  All other systems  reviewed and negative except where noted in HPI.   Wt Readings from Last 3 Encounters:  10/18/21 155 lb (70.3 kg)  10/18/21 155 lb (70.3 kg)  03/30/19 146 lb (66.2 kg)    Physical Exam:  There were no vitals taken for this visit. Constitutional:  Pleasant, generally well appearing ***female in no acute distress. Psychiatric:  Normal mood and affect. Behavior is normal. EENT: Pupils normal.  Conjunctivae are normal. No scleral icterus. Neck supple.  Cardiovascular: Normal rate, regular rhythm.  Pulmonary/chest: Effort normal and breath sounds normal. No wheezing, rales or rhonchi. Abdominal: Soft, nondistended, nontender. Bowel sounds active throughout. There are no masses palpable. No hepatomegaly. Neurological: Alert and oriented to person place and time. Extremities: *** No edema   Willette Cluster, NP  06/01/2023, 7:23 PM  Cc:  Referring Provider Jerl Mina, MD

## 2023-06-02 ENCOUNTER — Encounter: Payer: Self-pay | Admitting: Internal Medicine

## 2023-06-02 ENCOUNTER — Ambulatory Visit: Payer: BC Managed Care – PPO | Admitting: Nurse Practitioner

## 2023-06-02 ENCOUNTER — Encounter: Payer: Self-pay | Admitting: Nurse Practitioner

## 2023-06-02 VITALS — BP 118/64 | HR 82 | Ht 64.0 in | Wt 169.0 lb

## 2023-06-02 DIAGNOSIS — K219 Gastro-esophageal reflux disease without esophagitis: Secondary | ICD-10-CM | POA: Diagnosis not present

## 2023-06-02 MED ORDER — OMEPRAZOLE 40 MG PO CPDR: 40.0000 mg | DELAYED_RELEASE_CAPSULE | Freq: Every day | ORAL | 1 refills | Status: DC

## 2023-06-02 NOTE — Progress Notes (Signed)
I agree with the assessment and plan as outlined by Ms. Guenther. 

## 2023-06-02 NOTE — Patient Instructions (Addendum)
We have sent the following medications to your pharmacy for you to pick up at your convenience: Increase Omeprazole to 40 mg once daily 30-60 minutes before breakfast.   Acid Reflux  Below are some measures you can take to possibly improve acid reflux symptoms . We may have discussed some of these today in the office. Not everything on this list may apply to you   --If you are taking anti-reflux ( GERD) medication be sure to take it 30 minutes before breakfast and if taking twice daily then also second dose should be 30 minutes before dinner.   --Avoid late meals / bedtime snacks.   --Avoid trigger foods ( foods which you know tend to aggravate you reflux symptoms). Some common trigger foods include spicy foods, fatty foods, acidic foods, chocolate and caffeine.  --Elevate the head of bed 6-8 inches on blocks or bricks. If not able to elevate the head of the bed consider purchasing a wedge pillow to sleep on.    --Weight reduction / maintain a healthy BMI ( body mass index) may be help with reflux symptoms  --Sometimes with the above mentioned "lifestyle changes" patients are able to reduce the amount of GERD medications they take. Our goal is to have you on the lowest effective dose of medication

## 2023-06-03 ENCOUNTER — Encounter: Payer: Self-pay | Admitting: Certified Registered Nurse Anesthetist

## 2023-06-04 ENCOUNTER — Ambulatory Visit: Payer: BC Managed Care – PPO | Admitting: Internal Medicine

## 2023-06-04 ENCOUNTER — Encounter: Payer: Self-pay | Admitting: Internal Medicine

## 2023-06-04 VITALS — BP 98/60 | HR 69 | Temp 98.1°F | Resp 17 | Ht 64.0 in | Wt 169.0 lb

## 2023-06-04 DIAGNOSIS — K219 Gastro-esophageal reflux disease without esophagitis: Secondary | ICD-10-CM

## 2023-06-04 DIAGNOSIS — K317 Polyp of stomach and duodenum: Secondary | ICD-10-CM

## 2023-06-04 DIAGNOSIS — B9681 Helicobacter pylori [H. pylori] as the cause of diseases classified elsewhere: Secondary | ICD-10-CM

## 2023-06-04 DIAGNOSIS — K2950 Unspecified chronic gastritis without bleeding: Secondary | ICD-10-CM

## 2023-06-04 DIAGNOSIS — K3189 Other diseases of stomach and duodenum: Secondary | ICD-10-CM | POA: Diagnosis present

## 2023-06-04 MED ORDER — SODIUM CHLORIDE 0.9 % IV SOLN: 500.0000 mL | INTRAVENOUS | Status: DC

## 2023-06-04 NOTE — Progress Notes (Signed)
GASTROENTEROLOGY PROCEDURE H&P NOTE   Primary Care Physician: Jerl Mina, MD    Reason for Procedure:   GERD  Plan:    EGD  Patient is appropriate for endoscopic procedure(s) in the ambulatory ([facility]) setting.  The nature of the procedure, as well as the risks, benefits, and alternatives were carefully and thoroughly reviewed with the patient. Ample time for discussion and questions allowed. The patient understood, was satisfied, and agreed to proceed.     HPI: Katie Lambert is a 42 y.o. female who presents for EGD for evaluation of GERD .  Patient was most recently seen in the Gastroenterology Clinic on 06/02/23.  No interval change in medical history since that appointment. Please refer to that note for full details regarding GI history and clinical presentation.   Past Medical History:  Diagnosis Date   Acid reflux    Arthritis    Dysrhythmia    HTN (hypertension)     Past Surgical History:  Procedure Laterality Date   ABDOMINAL HYSTERECTOMY     KNEE ARTHROSCOPY Left 03/08/2015   Procedure: ARTHROSCOPY KNEE;  Surgeon: Juanell Fairly, MD;  Location: [facility];  Service: Orthopedics;  Laterality: Left;   KNEE ARTHROSCOPY Left 07/13/2015   Procedure: ARTHROSCOPY KNEE, partial lateral menisetomy, chondroplasty lateral femoral chondyl;  Surgeon: Juanell Fairly, MD;  Location: [facility];  Service: Orthopedics;  Laterality: Left;   KNEE SURGERY Left    x 2   OOPHORECTOMY     TUBAL LIGATION      Prior to Admission medications   Medication Sig Start Date End Date Taking? Authorizing Provider  estradiol (ESTRACE) 0.5 MG tablet Take 0.5 mg by mouth daily. 02/13/23  Yes [provider]  fexofenadine (ALLEGRA) 180 MG tablet Take 180 mg by mouth daily.   Yes [provider]  lisinopril-hydrochlorothiazide (ZESTORETIC) 20-12.5 MG tablet Take 1 tablet by mouth daily. 02/13/23  Yes [provider]  acyclovir (ZOVIRAX) 800 MG tablet Take 800 mg by  mouth as needed. 03/13/23   [provider]  meloxicam (MOBIC) 15 MG tablet Take 15 mg by mouth daily. 03/12/23   [provider]  omeprazole (PRILOSEC) 40 MG capsule Take 1 capsule (40 mg total) by mouth daily. Patient not taking: Reported on 06/04/2023 06/02/23   Meredith Pel, NP    Current Outpatient Medications  Medication Sig Dispense Refill   estradiol (ESTRACE) 0.5 MG tablet Take 0.5 mg by mouth daily.     fexofenadine (ALLEGRA) 180 MG tablet Take 180 mg by mouth daily.     lisinopril-hydrochlorothiazide (ZESTORETIC) 20-12.5 MG tablet Take 1 tablet by mouth daily.     acyclovir (ZOVIRAX) 800 MG tablet Take 800 mg by mouth as needed.     meloxicam (MOBIC) 15 MG tablet Take 15 mg by mouth daily.     omeprazole (PRILOSEC) 40 MG capsule Take 1 capsule (40 mg total) by mouth daily. (Patient not taking: Reported on 06/04/2023) 90 capsule 1   Current Facility-Administered Medications  Medication Dose Route Frequency Provider Last Rate Last Admin   0.9 %  sodium chloride infusion  500 mL Intravenous Continuous Iva Boop, MD        Allergies as of 06/04/2023   (No Known Allergies)    Family History  Problem Relation Age of Onset   Hypertension Father    Depression Mother    Hypertension Mother    Coronary artery disease Maternal Grandfather 2    Social History   Socioeconomic History  Marital status: Married    Spouse name: Not on file   Number of children: 2   Years of education: Not on file   Highest education level: Not on file  Occupational History    Employer: [employer]  Tobacco Use   Smoking status: Former    Average packs/day: 0.5 packs/day for 15.0 years (7.5 ttl pk-yrs)    Types: Cigarettes    Start date: 2021    Quit date: 1994    Years since quitting: 30.9   Smokeless tobacco: Never   Tobacco comments:    Currently electronic cigarettes  Substance and Sexual Activity   Alcohol use: Yes    Comment: social   Drug use: No    Sexual activity: Yes  Other Topics Concern   Not on file  Social History Narrative   ** Merged History Encounter **       Works two jobs, 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   Social Determinants of Health   Financial Resource Strain: Patient Declined (02/13/2023)   Received from [facility]   Overall Financial Resource Strain (CARDIA)    Difficulty of Paying Living Expenses: Patient declined  Food Insecurity: Patient Declined (02/13/2023)   Received from [facility]   Hunger Vital Sign    Worried About Running Out of Food in the Last Year: Patient declined    Ran Out of Food in the Last Year: Patient declined  Transportation Needs: Patient Declined (02/13/2023)   Received from [facility] - Transportation    In the past 12 months, has lack of transportation kept you from medical appointments or from getting medications?: Patient declined    Lack of Transportation (Non-Medical): Patient declined  Physical Activity: Not on file  Stress: Not on file  Social Connections: Not on file  Intimate Partner Violence: Not on file    Physical Exam: Vital signs in last 24 hours: BP 120/69   Pulse 73   Temp 98.1 F (36.7 C)   Ht 5\' 4"  (1.626 m)   Wt 169 lb (76.7 kg)   SpO2 98%   BMI 29.01 kg/m  GEN: NAD EYE: Sclerae anicteric ENT: MMM CV: Non-tachycardic Pulm: No increased WOB GI: Soft NEURO:  Alert & Oriented   Eulah Pont, MD [facility]   06/04/2023 11:01 AM

## 2023-06-04 NOTE — Progress Notes (Signed)
Pt's states no medical or surgical changes since previsit or office visit. 

## 2023-06-04 NOTE — Patient Instructions (Addendum)
YOU HAD AN ENDOSCOPIC PROCEDURE TODAY AT THE [facility]:   Refer to the procedure report that was given to you for any specific questions about what was found during the examination.  If the procedure report does not answer your questions, please call your gastroenterologist to clarify.  If you requested that your care partner not be given the details of your procedure findings, then the procedure report has been included in a sealed envelope for you to review at your convenience later.  YOU SHOULD EXPECT: Some feelings of bloating in the abdomen. Passage of more gas than usual.  Walking can help get rid of the air that was put into your GI tract during the procedure and reduce the bloating. If you had a lower endoscopy (such as a colonoscopy or flexible sigmoidoscopy) you may notice spotting of blood in your stool or on the toilet paper. If you underwent a bowel prep for your procedure, you may not have a normal bowel movement for a few days.  Please Note:  You might notice some irritation and congestion in your nose or some drainage.  This is from the oxygen used during your procedure.  There is no need for concern and it should clear up in a day or so.  SYMPTOMS TO REPORT IMMEDIATELY:  Following lower endoscopy (colonoscopy or flexible sigmoidoscopy):  Excessive amounts of blood in the stool  Significant tenderness or worsening of abdominal pains  Swelling of the abdomen that is new, acute  Fever of 100F or higher  For urgent or emergent issues, a gastroenterologist can be reached at any hour by calling [phone]. Do not use MyChart messaging for urgent concerns.    DIET:  We do recommend a small meal at first, but then you may proceed to your regular diet.  Drink plenty of fluids but you should avoid alcoholic beverages for 24 hours.  ACTIVITY:  You should plan to take it easy for the rest of today and you should NOT DRIVE or use heavy machinery until tomorrow (because of  the sedation medicines used during the test).    FOLLOW UP: Our staff will call the number listed on your records the next business day following your procedure.  We will call around 7:15- 8:00 am to check on you and address any questions or concerns that you may have regarding the information given to you following your procedure. If we do not reach you, we will leave a message.     If any biopsies were taken you will be contacted by phone or by letter within the next 1-3 weeks.  Please call us at [phone] if you have not heard about the biopsies in 3 weeks.    SIGNATURES/CONFIDENTIALITY: You and/or your care partner have signed paperwork which will be entered into your electronic medical record.  These signatures attest to the fact that that the information above on your After Visit Summary has been reviewed and is understood.  Full responsibility of the confidentiality of this discharge information lies with you and/or your care-partner.YOU HAD AN ENDOSCOPIC PROCEDURE TODAY AT THE [facility]:   Refer to the procedure report that was given to you for any specific questions about what was found during the examination.  If the procedure report does not answer your questions, please call your gastroenterologist to clarify.  If you requested that your care partner not be given the details of your procedure findings, then the procedure report has been included in a  sealed envelope for you to review at your convenience later.  YOU SHOULD EXPECT: Some feelings of bloating in the abdomen. Passage of more gas than usual.  Walking can help get rid of the air that was put into your GI tract during the procedure and reduce the bloating. If you had a lower endoscopy (such as a colonoscopy or flexible sigmoidoscopy) you may notice spotting of blood in your stool or on the toilet paper. If you underwent a bowel prep for your procedure, you may not have a normal bowel movement for a few  days.  Please Note:  You might notice some irritation and congestion in your nose or some drainage.  This is from the oxygen used during your procedure.  There is no need for concern and it should clear up in a day or so.  SYMPTOMS TO REPORT IMMEDIATELY:   Following upper endoscopy (EGD)  Vomiting of blood or coffee ground material  New chest pain or pain under the shoulder blades  Painful or persistently difficult swallowing  New shortness of breath  Fever of 100F or higher  Black, tarry-looking stools  For urgent or emergent issues, a gastroenterologist can be reached at any hour by calling [phone]. Do not use MyChart messaging for urgent concerns.    DIET:  We do recommend a small meal at first, but then you may proceed to your regular diet.  Drink plenty of fluids but you should avoid alcoholic beverages for 24 hours.  ACTIVITY:  You should plan to take it easy for the rest of today and you should NOT DRIVE or use heavy machinery until tomorrow (because of the sedation medicines used during the test).    FOLLOW UP: Our staff will call the number listed on your records the next business day following your procedure.  We will call around 7:15- 8:00 am to check on you and address any questions or concerns that you may have regarding the information given to you following your procedure. If we do not reach you, we will leave a message.     If any biopsies were taken you will be contacted by phone or by letter within the next 1-3 weeks.  Please call us at [phone] if you have not heard about the biopsies in 3 weeks.    SIGNATURES/CONFIDENTIALITY: You and/or your care partner have signed paperwork which will be entered into your electronic medical record.  These signatures attest to the fact that that the information above on your After Visit Summary has been reviewed and is understood.  Full responsibility of the confidentiality of this discharge information lies with you  and/or your care-partner.

## 2023-06-04 NOTE — Progress Notes (Signed)
1132 Robinul 0.1 mg IV given due large amount of secretions upon assessment.  MD made aware, vss

## 2023-06-04 NOTE — Progress Notes (Signed)
Called to room to assist during endoscopic procedure.  Patient ID and intended procedure confirmed with present staff. Received instructions for my participation in the procedure from the performing physician.  

## 2023-06-04 NOTE — Progress Notes (Signed)
Report given to PACU, vss 

## 2023-06-04 NOTE — Op Note (Signed)
[facility] Patient Name: Katie Lambert Procedure Date: 06/04/2023 11:34 AM MRN: [id number] Endoscopist: Particia Lather , , [id number] Age: 42 Referring MD:  Date of Birth: 30-Jan-1981 Gender: Female Account #: [id number] Procedure:                Upper GI endoscopy Indications:              Heartburn Medicines:                Monitored Anesthesia Care Procedure:                Pre-Anesthesia Assessment:                           - Prior to the procedure, a History and Physical                            was performed, and patient medications and                            allergies were reviewed. The patient's tolerance of                            previous anesthesia was also reviewed. The risks                            and benefits of the procedure and the sedation                            options and risks were discussed with the patient.                            All questions were answered, and informed consent                            was obtained. Prior Anticoagulants: The patient has                            taken no anticoagulant or antiplatelet agents. ASA                            Grade Assessment: II - A patient with mild systemic                            disease. After reviewing the risks and benefits,                            the patient was deemed in satisfactory condition to                            undergo the procedure.                           After obtaining informed consent, the endoscope was  passed under direct vision. Throughout the                            procedure, the patient's blood pressure, pulse, and                            oxygen saturations were monitored continuously. The                            GIF [id number] #[id number] was introduced through the                            mouth, and advanced to the second part of duodenum.                            The upper GI endoscopy was  accomplished without                            difficulty. The patient tolerated the procedure                            well. Scope In: Scope Out: Findings:                 The examined esophagus was normal.                           Localized mild mucosal variance characterized by                            altered texture was found in the gastric fundus.                            Biopsies were taken with a cold forceps for                            histology.                           A single 3 mm sessile polyp with no bleeding was                            found in the duodenal bulb. The polyp was removed                            with a cold biopsy forceps. Resection and retrieval                            were complete. Complications:            No immediate complications. Estimated Blood Loss:     Estimated blood loss was minimal. Impression:               - Normal esophagus.                           -  Gastric mucosal variant. Biopsied.                           - A single duodenal polyp. Resected and retrieved. Recommendation:           - Discharge patient to home (with escort).                           - Await pathology results.                           - Return to GI clinic in 2-3 months.                           - The findings and recommendations were discussed                            with the patient. Dr Particia Lather "Alan Ripper" Leonides Schanz,  06/04/2023 11:47:02 AM

## 2023-06-05 ENCOUNTER — Telehealth: Payer: Self-pay

## 2023-06-05 NOTE — Telephone Encounter (Signed)
  Follow up Call-     06/04/2023   10:51 AM  Call back number  Post procedure Call Back phone  # [phone]  Permission to leave phone message Yes     Patient questions:  Do you have a fever, pain , or abdominal swelling? No. Pain Score  0 *  Have you tolerated food without any problems? Yes.    Have you been able to return to your normal activities? Yes.    Do you have any questions about your discharge instructions: Diet   No. Medications  No. Follow up visit  No.  Do you have questions or concerns about your Care? No.  Actions: * If pain score is 4 or above: No action needed, pain <4.

## 2023-06-06 ENCOUNTER — Encounter: Payer: Self-pay | Admitting: Internal Medicine

## 2023-06-06 ENCOUNTER — Other Ambulatory Visit: Payer: Self-pay

## 2023-06-06 LAB — SURGICAL PATHOLOGY

## 2023-06-06 MED ORDER — TETRACYCLINE HCL 500 MG PO CAPS: 500.0000 mg | ORAL_CAPSULE | Freq: Four times a day (QID) | ORAL | 0 refills | Status: DC

## 2023-06-06 MED ORDER — METRONIDAZOLE 250 MG PO TABS: 250.0000 mg | ORAL_TABLET | Freq: Four times a day (QID) | ORAL | 0 refills | Status: DC

## 2023-06-06 NOTE — Progress Notes (Signed)
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 Recommend bismuth quadruple therapy for treatment: - Tetracycline 500 mg QID x 14 days - Flagyl 250 mg QID x 14 days - Bismuth subsalicylate 524 mg QID x 14 days - PPI BID x 14 days  We can test her for H pylori eradication during her follow up GI appointment.

## 2023-06-10 ENCOUNTER — Telehealth: Payer: Self-pay | Admitting: Internal Medicine

## 2023-06-10 NOTE — Telephone Encounter (Signed)
Inbound call from patient stating she woke up with a rash on her face. States she believes it may be from medication that was were prescribed for her after 12/11 procedure. Requesting a call back to discuss further. Please advise, thank you.

## 2023-06-10 NOTE — Telephone Encounter (Addendum)
Spoke with the patient again. She does not feel any changes in her breathing, tingling or swelling of her lips or tongue. She is able to swallow her food and drink. She does not own an Epi-pen. She tells me she has never had a reaction to medications. "It's the seasonal allergies that kick my butt." She will continue to monitor, check her face in a mirror and repeat the Benadryl in 6 hours if she is still symptomatic. ER if she acutely worsens.

## 2023-06-10 NOTE — Telephone Encounter (Signed)
Patient started Flagyl and tetracycline this weekend for treatment of H Pylori. This morning she has rash, redness, puffiness and itching of her face and trunk. Complains of abdominal pain that is "difficult to describe but a cramping kind of pain." She is passing normal stool.  I have asked her to hold the antibiotics. Take her daily fexofenadine and a Benadryl.  Please advise.

## 2023-06-10 NOTE — Telephone Encounter (Signed)
Katie Lambert, if in 1 week, she is feeling better, then we can have her do triple therapy with amoxicillin 1 g BID, clarithromycin 500 mg BID, and PPI BID for 14 days for treatment of H pylori. I suspect that her prior allergic reaction was most likely to tetracycline.

## 2023-06-10 NOTE — Telephone Encounter (Signed)
Patient advised.

## 2023-06-20 ENCOUNTER — Other Ambulatory Visit: Payer: Self-pay

## 2023-06-20 MED ORDER — CLARITHROMYCIN 500 MG PO TABS: 500.0000 mg | ORAL_TABLET | Freq: Two times a day (BID) | ORAL | 0 refills | Status: AC

## 2023-06-20 MED ORDER — AMOXICILLIN 500 MG PO TABS: 1000.0000 mg | ORAL_TABLET | Freq: Two times a day (BID) | ORAL | 0 refills | Status: AC

## 2023-06-20 NOTE — Telephone Encounter (Signed)
Spoke with the patient. She reports resolution of the rash. She agrees to try treatment for H Pylori with new antibiotics. Reviewed the plan of care with her. Pharmacy confirmed.

## 2023-08-20 ENCOUNTER — Ambulatory Visit: Payer: Self-pay | Admitting: Gastroenterology

## 2023-09-24 ENCOUNTER — Encounter: Payer: Self-pay | Admitting: Gastroenterology

## 2023-09-24 ENCOUNTER — Ambulatory Visit: Payer: Self-pay | Admitting: Gastroenterology

## 2023-09-24 VITALS — BP 124/80 | HR 74 | Ht 64.0 in | Wt 174.4 lb

## 2023-09-24 DIAGNOSIS — Z8619 Personal history of other infectious and parasitic diseases: Secondary | ICD-10-CM | POA: Diagnosis not present

## 2023-09-24 DIAGNOSIS — Z1211 Encounter for screening for malignant neoplasm of colon: Secondary | ICD-10-CM

## 2023-09-24 DIAGNOSIS — K219 Gastro-esophageal reflux disease without esophagitis: Secondary | ICD-10-CM | POA: Diagnosis not present

## 2023-09-24 MED ORDER — OMEPRAZOLE 20 MG PO CPDR: 20.0000 mg | DELAYED_RELEASE_CAPSULE | Freq: Every day | ORAL | 3 refills | Status: DC

## 2023-09-24 MED ORDER — NA SULFATE-K SULFATE-MG SULF 17.5-3.13-1.6 GM/177ML PO SOLN: 1.0000 | Freq: Once | ORAL | 0 refills | Status: AC

## 2023-09-24 NOTE — Patient Instructions (Signed)
 We have sent the following medications to your pharmacy for you to pick up at your convenience: Decrease to Omeprazole 20 mg once daily 30-60 minutes before breakfast.   Start Calcium & Vitamin D supplement.   Your provider has ordered "Diatherix" stool testing for you. You have received a kit from our office today containing all necessary supplies to complete this test. Please carefully read the stool collection instructions provided in the kit before opening the accompanying materials. In addition, be sure there is a label providing your full name and date of birth on the "puritan opti-swab" tube that is supplied in the kit (if you do not see a label with this information on your test tube, please make [place] aware before test collection!). After completing the test, you should secure the purtian tube into the specimen biohazard bag. The [facility] E-Req sheet (including date and time of specimen collection) should be placed into the outside pocket of the specimen biohazard bag and returned to the [place] lab (basement floor of [facility]) within 3 days of collection. Please make sure to give the specimen to a staff member at the lab. DO NOT leave the specimen on the counter.   If the specimen date and time (can be found in the upper right boxed portion of the sheet) are not filled out on the E-Req sheet, the test will NOT be performed.   You have been scheduled for a colonoscopy. Please follow written instructions given to you at your visit today.   If you use inhalers (even only as needed), please bring them with you on the day of your procedure.  DO NOT TAKE 7 DAYS PRIOR TO TEST- Trulicity (dulaglutide) Ozempic, Wegovy (semaglutide) Mounjaro (tirzepatide) Bydureon Bcise (exanatide extended release)  DO NOT TAKE 1 DAY PRIOR TO YOUR TEST Rybelsus (semaglutide) Adlyxin (lixisenatide) Victoza (liraglutide) Byetta  (exanatide) ___________________________________________________________________________  If your blood pressure at your visit was 140/90 or greater, please contact your primary care physician to follow up on this.  _______________________________________________________  If you are age 45 or older, your body mass index should be between 23-30. Your Body mass index is 29.94 kg/m. If this is out of the aforementioned range listed, please consider follow up with your Primary Care Provider.  If you are age 76 or younger, your body mass index should be between 19-25. Your Body mass index is 29.94 kg/m. If this is out of the aformentioned range listed, please consider follow up with your Primary Care Provider.   ________________________________________________________  The [facility] providers would like to encourage you to use [facility] to communicate with providers for non-urgent requests or questions.  Due to long hold times on the telephone, sending your provider a message by [facility] may be a faster and more efficient way to get a response.  Please allow 48 business hours for a response.  Please remember that this is for non-urgent requests.  _______________________________________________________

## 2023-09-24 NOTE — Progress Notes (Signed)
I agree with assessment and plan as outlined by Ms. Myrtice Lauth.

## 2023-09-24 NOTE — Progress Notes (Signed)
 09/24/2023 Katie Lambert [id number] Dec 29, 1980   HISTORY OF PRESENT ILLNESS: This is a 43 year old female who is a patient of Dr. Derek Mound.  She is followed here for follow-up after her EGD.  That was performed in December 2024 as below.  Found to have H. pylori.  Was treated with tetracycline, Flagyl, bismuth, BID PPI x 14 days, but developed a rash.  That was discontinued.  She was then treated with amoxicillin, clarithromycin, PPI BID for 14 days and was able to complete that course.  She had been eating a lot of peppermint candies.  She discontinued eating those as well.  She still on omeprazole 40 mg daily.  She says that she feels really good.  She does have osteopenia.  Because she is feeling well she was initially skeptical on decreasing dose of her omeprazole.  She has never had a colonoscopy in the past.  She says she moves her bowels well without any issues.  No rectal bleeding.  EGD 05/2023:  - Normal esophagus. - Gastric mucosal variant. Biopsied. - A single duodenal polyp. Resected and retrieved.       1. Surgical [P], duodenal polyp x 1, polyp (1) :       -  COLONIC MUCOSA WITH PROMINENT BRUNNER'S GLANDS.        2. Surgical [P], gastric bx :       -  ANTRAL AND OXYNTIC MUCOSA WITH MODERATE CHRONIC FOCAL MINIMALLY ACTIVE       HELICOBACTER ASSOCIATED GASTRITIS.       -  NUMEROUS HELICOBACTER PYLORI ORGANISMS ARE IDENTIFIED ON H&E-STAINED SLIDE.   Past Medical History:  Diagnosis Date   Acid reflux    Arthritis    Dysrhythmia    HTN (hypertension)    Past Surgical History:  Procedure Laterality Date   ABDOMINAL HYSTERECTOMY     KNEE ARTHROSCOPY Left 03/08/2015   Procedure: ARTHROSCOPY KNEE;  Surgeon: Juanell Fairly, MD;  Location: [facility] ORS;  Service: Orthopedics;  Laterality: Left;   KNEE ARTHROSCOPY Left 07/13/2015   Procedure: ARTHROSCOPY KNEE, partial lateral menisetomy, chondroplasty lateral femoral chondyl;  Surgeon: Juanell Fairly, MD;  Location: [facility]  ORS;  Service: Orthopedics;  Laterality: Left;   KNEE SURGERY Left    x 2   OOPHORECTOMY     TUBAL LIGATION      reports that she quit smoking about 31 years ago. Her smoking use included cigarettes. She started smoking about 4 years ago. She has a 7.5 pack-year smoking history. She has never used smokeless tobacco. She reports current alcohol use. She reports that she does not use drugs. family history includes Coronary artery disease (age of onset: 35) in her maternal grandfather; Depression in her mother; Hypertension in her father and mother. Allergies  Allergen Reactions   Flagyl [Metronidazole] Rash   Tetracyclines & Related Rash      Outpatient Encounter Medications as of 09/24/2023  Medication Sig   acyclovir (ZOVIRAX) 800 MG tablet Take 800 mg by mouth as needed.   estradiol (ESTRACE) 0.5 MG tablet Take 0.5 mg by mouth daily.   fexofenadine (ALLEGRA) 180 MG tablet Take 180 mg by mouth daily.   lisinopril-hydrochlorothiazide (ZESTORETIC) 20-12.5 MG tablet Take 1 tablet by mouth daily.   omeprazole (PRILOSEC) 40 MG capsule Take 1 capsule (40 mg total) by mouth daily.   [DISCONTINUED] meloxicam (MOBIC) 15 MG tablet Take 15 mg by mouth daily.   No facility-administered encounter medications on file as of 09/24/2023.  REVIEW OF SYSTEMS  : All other systems reviewed and negative except where noted in the History of Present Illness.   PHYSICAL EXAM: BP 124/80 (Cuff Size: Normal)   Pulse 74   Ht 5\' 4"  (1.626 m)   Wt 174 lb 6.4 oz (79.1 kg)   BMI 29.94 kg/m  General: Well developed white female in no acute distress Head: Normocephalic and atraumatic Eyes:  sclerae anicteric,conjunctive pink. Ears: Normal auditory acuity Lungs: Clear throughout to auscultation Heart: Regular rate and rhythm Abdomen: Soft, nontender, non distended. No masses or hepatomegaly noted. Normal bowel sounds Musculoskeletal: Symmetrical with no gross deformities  Skin: No lesions on visible  extremities Extremities: No edema  Neurological: Alert oriented x 4, grossly non-focal Psychological:  Alert and cooperative. Normal mood and affect  ASSESSMENT AND PLAN: *H. Pylori: Was treated with tetracycline, Flagyl, bismuth, BID PPI x 14 days, but developed a rash.  That was discontinued.  She was then treated with amoxicillin, clarithromycin, PPI BID for 14 days and was able to complete that course.  Will check stool Diatherix for H. pylori to confirm eradication. *GERD: Symptoms much improved after discontinuing eating her peppermint candies and hopefully with the resolution of H. pylori.  She is still on omeprazole 40 mg daily.  She does have osteopenia.  We discussed decreasing down to 20 mg daily to see how she does.  She would like to try that and will let us know if symptoms return and she is to go back to 40 mg daily.  Will begin calcium and vitamin D supplement as well. *Colorectal cancer screening: Never had colonoscopy in the past.  Is now age 40.  Will schedule with Dr. Leonides Schanz.  The risks, benefits, and alternatives to colonoscopy were discussed with the patient and she consents to proceed.    CC:  Jerl Mina, MD

## 2023-10-06 ENCOUNTER — Telehealth: Payer: Self-pay | Admitting: *Deleted

## 2023-10-06 NOTE — Telephone Encounter (Signed)
 Left message for patient to call office.

## 2023-10-06 NOTE — Telephone Encounter (Signed)
 Patient called and stated that she was returning a call to heather.Patient is requesting a call back. Please advise.

## 2023-10-06 NOTE — Telephone Encounter (Signed)
-----   Message from [place] D. Zehr sent at 10/06/2023 10:33 AM EDT ----- Please let the patient know that her H. pylori testing was negative, it has been completely eradicated with the regimen that she took.  Proceed with upcoming colonoscopy.  Thank you,  Jess

## 2023-10-07 NOTE — Telephone Encounter (Signed)
 Patient informed of results.

## 2023-10-10 ENCOUNTER — Encounter: Payer: Self-pay | Admitting: Internal Medicine

## 2023-10-20 ENCOUNTER — Ambulatory Visit: Admitting: Internal Medicine

## 2023-10-20 ENCOUNTER — Encounter: Payer: Self-pay | Admitting: Internal Medicine

## 2023-10-20 VITALS — BP 118/76 | HR 74 | Temp 97.5°F | Resp 16 | Ht 64.0 in | Wt 174.0 lb

## 2023-10-20 DIAGNOSIS — Z1211 Encounter for screening for malignant neoplasm of colon: Secondary | ICD-10-CM

## 2023-10-20 DIAGNOSIS — D122 Benign neoplasm of ascending colon: Secondary | ICD-10-CM

## 2023-10-20 DIAGNOSIS — K648 Other hemorrhoids: Secondary | ICD-10-CM | POA: Diagnosis not present

## 2023-10-20 MED ORDER — SODIUM CHLORIDE 0.9 % IV SOLN: 500.0000 mL | INTRAVENOUS | Status: AC

## 2023-10-20 NOTE — Progress Notes (Signed)
 Called to room to assist during endoscopic procedure.  Patient ID and intended procedure confirmed with present staff. Received instructions for my participation in the procedure from the performing physician.

## 2023-10-20 NOTE — Progress Notes (Unsigned)
 Patient states there have been no changes to medical or surgical history since time of pre-visit.

## 2023-10-20 NOTE — Progress Notes (Unsigned)
 GASTROENTEROLOGY PROCEDURE H&P NOTE   Primary Care Physician: Lyle San, MD    Reason for Procedure:   Colon cancer screening  Plan:    Colonoscopy  Patient is appropriate for endoscopic procedure(s) in the ambulatory ([facility]) setting.  The nature of the procedure, as well as the risks, benefits, and alternatives were carefully and thoroughly reviewed with the patient. Ample time for discussion and questions allowed. The patient understood, was satisfied, and agreed to proceed.     HPI: Katie Lambert is a 42 y.o. female who presents for colonoscopy for colon cancer screening. Denies blood in stools, changes in bowel habits, or unintentional weight loss. Denies family history of colon cancer.  Past Medical History:  Diagnosis Date   Acid reflux    Arthritis    Dysrhythmia    HTN (hypertension)     Past Surgical History:  Procedure Laterality Date   ABDOMINAL HYSTERECTOMY     KNEE ARTHROSCOPY Left 03/08/2015   Procedure: ARTHROSCOPY KNEE;  Surgeon: Rande Bushy, MD;  Location: [facility] ORS;  Service: Orthopedics;  Laterality: Left;   KNEE ARTHROSCOPY Left 07/13/2015   Procedure: ARTHROSCOPY KNEE, partial lateral menisetomy, chondroplasty lateral femoral chondyl;  Surgeon: Rande Bushy, MD;  Location: [facility] ORS;  Service: Orthopedics;  Laterality: Left;   KNEE SURGERY Left    x 2   OOPHORECTOMY     TUBAL LIGATION      Prior to Admission medications   Medication Sig Start Date End Date Taking? Authorizing Provider  cetirizine (ZYRTEC) 5 MG tablet Take 5 mg by mouth daily.   Yes [provider]  estradiol (ESTRACE) 0.5 MG tablet Take 0.5 mg by mouth daily. 02/13/23  Yes [provider]  lisinopril-hydrochlorothiazide (ZESTORETIC) 20-12.5 MG tablet Take 1 tablet by mouth daily. 02/13/23  Yes [provider]  omeprazole  (PRILOSEC) 20 MG capsule Take 1 capsule (20 mg total) by mouth daily. 09/24/23  Yes Zehr, Jessica D, PA-C  acyclovir  (ZOVIRAX) 800 MG tablet Take 800 mg by mouth as needed. 03/13/23   [provider]    Current Outpatient Medications  Medication Sig Dispense Refill   cetirizine (ZYRTEC) 5 MG tablet Take 5 mg by mouth daily.     estradiol (ESTRACE) 0.5 MG tablet Take 0.5 mg by mouth daily.     lisinopril-hydrochlorothiazide (ZESTORETIC) 20-12.5 MG tablet Take 1 tablet by mouth daily.     omeprazole  (PRILOSEC) 20 MG capsule Take 1 capsule (20 mg total) by mouth daily. 90 capsule 3   acyclovir (ZOVIRAX) 800 MG tablet Take 800 mg by mouth as needed.     Current Facility-Administered Medications  Medication Dose Route Frequency Provider Last Rate Last Admin   0.9 %  sodium chloride  infusion  500 mL Intravenous Continuous Daina Drum, MD        Allergies as of 10/20/2023 - Review Complete 10/20/2023  Allergen Reaction Noted   Flagyl  [metronidazole ] Rash 06/20/2023   Tetracyclines & related Rash 06/20/2023    Family History  Problem Relation Age of Onset   Depression Mother    Hypertension Mother    Hypertension Father    Coronary artery disease Maternal Grandfather 48   Colon cancer Neg Hx    Esophageal cancer Neg Hx    Stomach cancer Neg Hx    Rectal cancer Neg Hx    Colon polyps Neg Hx     Social History   Socioeconomic History   Marital status: Married    Spouse name: Not on file  Number of children: 2   Years of education: Not on file   Highest education level: Not on file  Occupational History    Employer: [employer]  Tobacco Use   Smoking status: Former    Average packs/day: 0.5 packs/day for 15.0 years (7.5 ttl pk-yrs)    Types: Cigarettes    Start date: 2021    Quit date: 1994    Years since quitting: 31.3   Smokeless tobacco: Never   Tobacco comments:    Currently electronic cigarettes  Substance and Sexual Activity   Alcohol use: Yes    Comment: social   Drug use: No   Sexual activity: Yes  Other Topics Concern   Not on file  Social History Narrative    ** Merged History Encounter **       Works two jobs, 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   Social Drivers of Health   Financial Resource Strain: Patient Declined (02/13/2023)   Received from [facility]   Overall Financial Resource Strain (CARDIA)    Difficulty of Paying Living Expenses: Patient declined  Food Insecurity: Patient Declined (02/13/2023)   Received from [facility]   Hunger Vital Sign    Worried About Running Out of Food in the Last Year: Patient declined    Ran Out of Food in the Last Year: Patient declined  Transportation Needs: Patient Declined (02/13/2023)   Received from [facility] - [facility]    In the past 12 months, has lack of transportation kept you from medical appointments or from getting medications?: Patient declined    Lack of Transportation (Non-Medical): Patient declined  Physical Activity: Not on file  Stress: Not on file  Social Connections: Not on file  Intimate Partner Violence: Not on file    Physical Exam: Vital signs in last 24 hours: BP 111/76   Pulse 86   Temp (!) 97.5 F (36.4 C) (Skin)   Ht 5\' 4"  (1.626 m)   Wt 174 lb (78.9 kg)   SpO2 100%   BMI 29.87 kg/m  GEN: NAD EYE: Sclerae anicteric ENT: MMM CV: Non-tachycardic Pulm: No increased work of breathing GI: Soft, NT/ND NEURO:  Alert & Oriented   Regino Caprio, MD [facility]  10/20/2023 3:47 PM

## 2023-10-20 NOTE — Progress Notes (Unsigned)
 Vss nad trans to pacu

## 2023-10-20 NOTE — Op Note (Signed)
 [facility] Patient Name: Katie Lambert Procedure Date: 10/20/2023 3:49 PM MRN: [id number] Endoscopist: Pedro Bourgeois , , [id number] Age: 43 Referring MD:  Date of Birth: June 08, 1981 Gender: Female Account #: [id number] Procedure:                Colonoscopy Indications:              Screening for colorectal malignant neoplasm, This                            is the patient's first colonoscopy Medicines:                Monitored Anesthesia Care Procedure:                Pre-Anesthesia Assessment:                           - Prior to the procedure, a History and Physical                            was performed, and patient medications and                            allergies were reviewed. The patient's tolerance of                            previous anesthesia was also reviewed. The risks                            and benefits of the procedure and the sedation                            options and risks were discussed with the patient.                            All questions were answered, and informed consent                            was obtained. Prior Anticoagulants: The patient has                            taken no anticoagulant or antiplatelet agents. ASA                            Grade Assessment: II - A patient with mild systemic                            disease. After reviewing the risks and benefits,                            the patient was deemed in satisfactory condition to                            undergo the procedure.  After obtaining informed consent, the colonoscope                            was passed under direct vision. Throughout the                            procedure, the patient's blood pressure, pulse, and                            oxygen saturations were monitored continuously. The                            Olympus Scope SN [id number] was introduced through the                            anus and  advanced to the the terminal ileum. The                            colonoscopy was performed without difficulty. The                            patient tolerated the procedure well. The quality                            of the bowel preparation was excellent. The                            terminal ileum, ileocecal valve, appendiceal                            orifice, and rectum were photographed. Scope In: 4:00:08 PM Scope Out: 4:15:54 PM Scope Withdrawal Time: 0 hours 10 minutes 14 seconds  Total Procedure Duration: 0 hours 15 minutes 46 seconds  Findings:                 The terminal ileum appeared normal.                           A 2 mm polyp was found in the ascending colon. The                            polyp was sessile. The polyp was removed with a                            cold biopsy forceps. Resection and retrieval were                            complete.                           Non-bleeding internal hemorrhoids were found during                            retroflexion. Complications:  No immediate complications. Estimated Blood Loss:     Estimated blood loss was minimal. Impression:               - The examined portion of the ileum was normal.                           - One 2 mm polyp in the ascending colon, removed                            with a cold biopsy forceps. Resected and retrieved.                           - Non-bleeding internal hemorrhoids. Recommendation:           - Discharge patient to home (with escort).                           - Await pathology results.                           - The findings and recommendations were discussed                            with the patient. Dr Pedro Bourgeois "Anastacio Balm" Deangela Coma,  10/20/2023 4:18:20 PM

## 2023-10-20 NOTE — Patient Instructions (Signed)
Please read handouts provided Await pathology results.   YOU HAD AN ENDOSCOPIC PROCEDURE TODAY AT [facility]:   Refer to the procedure report that was given to you for any specific questions about what was found during the examination.  If the procedure report does not answer your questions, please call your gastroenterologist to clarify.  If you requested that your care partner not be given the details of your procedure findings, then the procedure report has been included in a sealed envelope for you to review at your convenience later.  YOU SHOULD EXPECT: Some feelings of bloating in the abdomen. Passage of more gas than usual.  Walking can help get rid of the air that was put into your GI tract during the procedure and reduce the bloating. If you had a lower endoscopy (such as a colonoscopy or flexible sigmoidoscopy) you may notice spotting of blood in your stool or on the toilet paper. If you underwent a bowel prep for your procedure, you may not have a normal bowel movement for a few days.  Please Note:  You might notice some irritation and congestion in your nose or some drainage.  This is from the oxygen used during your procedure.  There is no need for concern and it should clear up in a day or so.  SYMPTOMS TO REPORT IMMEDIATELY:  Following lower endoscopy (colonoscopy or flexible sigmoidoscopy):  Excessive amounts of blood in the stool  Significant tenderness or worsening of abdominal pains  Swelling of the abdomen that is new, acute  Fever of 100F or higher  For urgent or emergent issues, a gastroenterologist can be reached at any hour by calling [phone]. Do not use MyChart messaging for urgent concerns.    DIET:  We do recommend a small meal at first, but then you may proceed to your regular diet.  Drink plenty of fluids but you should avoid alcoholic beverages for 24 hours.  ACTIVITY:  You should plan to take it easy for the rest of today and you should  NOT DRIVE or use heavy machinery until tomorrow (because of the sedation medicines used during the test).    FOLLOW UP: Our staff will call the number listed on your records the next business day following your procedure.  We will call around 7:15- 8:00 am to check on you and address any questions or concerns that you may have regarding the information given to you following your procedure. If we do not reach you, we will leave a message.     If any biopsies were taken you will be contacted by phone or by letter within the next 1-3 weeks.  Please call us at [phone] if you have not heard about the biopsies in 3 weeks.    SIGNATURES/CONFIDENTIALITY: You and/or your care partner have signed paperwork which will be entered into your electronic medical record.  These signatures attest to the fact that that the information above on your After Visit Summary has been reviewed and is understood.  Full responsibility of the confidentiality of this discharge information lies with you and/or your care-partner.

## 2023-10-21 ENCOUNTER — Telehealth: Payer: Self-pay | Admitting: *Deleted

## 2023-10-21 NOTE — Telephone Encounter (Signed)
  Follow up Call-     10/20/2023    3:24 PM 06/04/2023   10:51 AM  Call back number  Post procedure Call Back phone  # [phone]  Permission to leave phone message Yes Yes     Patient questions:  Do you have a fever, pain , or abdominal swelling? No. Pain Score  0 *  Have you tolerated food without any problems? Yes.    Have you been able to return to your normal activities? Yes.    Do you have any questions about your discharge instructions: Diet   No. Medications  No. Follow up visit  No.  Do you have questions or concerns about your Care? No.  Actions: * If pain score is 4 or above: No action needed, pain <4.

## 2023-10-24 ENCOUNTER — Encounter: Payer: Self-pay | Admitting: Internal Medicine

## 2023-10-24 LAB — SURGICAL PATHOLOGY

## 2023-11-07 ENCOUNTER — Encounter: Payer: Self-pay | Admitting: Gastroenterology

## 2023-11-13 ENCOUNTER — Other Ambulatory Visit: Payer: Self-pay | Admitting: Nurse Practitioner

## 2024-03-26 NOTE — Progress Notes (Unsigned)
 Sleep Medicine   Office Visit  Patient Name: Katie Lambert DOB: [birth year] MRN [id number]    Chief Complaint: OSA  Brief History:  Katie Lambert presents for an initial consult for sleep evaluation and to establish care. The patient has a 1 year history of sleep apnea and is currently on a CPAP. Prior to using a PAP, sleep quality was poor. This was noted every night. The patient reported the following symptoms:  snoring and witnessed apneic pauses. The patient goes to sleep at 1000 pm and wakes up at 0630 am. The patient no history of psychiatric problems. The Epworth Sleepiness Score is 9 out of 24 . The patient relates  Cardiovascular risk factors include: HTN. The patient is currently on a CPAP@ 8 cmH2O. The patient reports using her PAP and feels rested after sleeping with PAP.  The patient reports benefiting from PAP use and would like for her to continue using PAP. Reported sleepiness is  improved.  AirView is currently down at this time and unable to download data from machine. However, able to see the compliance on pt's unit directly. The compliance data shows  97% compliance with an average use time of 6.7 hours. The AHI is 4.3.  Leakage is 25.  95th % pressure is 7.9. The patient continues to require PAP therapy as a medical necessity in order to eliminate her sleep apnea.    ROS  General: (-) fever, (-) chills, (-) night sweat Nose and Sinuses: (-) nasal stuffiness or itchiness, (-) postnasal drip, (-) nosebleeds, (-) sinus trouble. Mouth and Throat: (-) sore throat, (-) hoarseness. Neck: (-) swollen glands, (-) enlarged thyroid , (-) neck pain. Respiratory: - cough, - shortness of breath, - wheezing. Neurologic: - numbness, - tingling. Psychiatric: - anxiety, - depression Sleep behavior: -sleep paralysis -hypnogogic hallucinations -dream enactment      -vivid dreams -cataplexy -night terrors -sleep walking   Current Medication: Outpatient Encounter Medications as of  03/29/2024  Medication Sig   acyclovir (ZOVIRAX) 800 MG tablet Take 800 mg by mouth as needed.   cetirizine (ZYRTEC) 5 MG tablet Take 5 mg by mouth daily.   estradiol (ESTRACE) 0.5 MG tablet Take 0.5 mg by mouth daily.   lisinopril-hydrochlorothiazide (ZESTORETIC) 20-12.5 MG tablet Take 1 tablet by mouth daily.   omeprazole  (PRILOSEC) 20 MG capsule Take 1 capsule (20 mg total) by mouth daily.   [DISCONTINUED] omeprazole  (PRILOSEC) 20 MG capsule Take 1 capsule (20 mg total) by mouth daily.   No facility-administered encounter medications on file as of 03/29/2024.    Surgical History: Past Surgical History:  Procedure Laterality Date   ABDOMINAL HYSTERECTOMY     KNEE ARTHROSCOPY Left 03/08/2015   Procedure: ARTHROSCOPY KNEE;  Surgeon: Franky Cranker, MD;  Location: [facility] ORS;  Service: Orthopedics;  Laterality: Left;   KNEE ARTHROSCOPY Left 07/13/2015   Procedure: ARTHROSCOPY KNEE, partial lateral menisetomy, chondroplasty lateral femoral chondyl;  Surgeon: Franky Cranker, MD;  Location: [facility] ORS;  Service: Orthopedics;  Laterality: Left;   KNEE SURGERY Left    x 2   OOPHORECTOMY     TUBAL LIGATION      Medical History: Past Medical History:  Diagnosis Date   Acid reflux    Arthritis    Dysrhythmia    HTN (hypertension)     Family History: Non contributory to the present illness  Social History: Social History   Socioeconomic History   Marital status: Married    Spouse name: Not on file   Number of children: 2  Years of education: Not on file   Highest education level: Not on file  Occupational History    Employer: [employer]  Tobacco Use   Smoking status: Former    Average packs/day: 0.5 packs/day for 15.0 years (7.5 ttl pk-yrs)    Types: Cigarettes    Start date: 2021    Quit date: 1994    Years since quitting: 31.7   Smokeless tobacco: Never   Tobacco comments:    Currently electronic cigarettes  Substance and Sexual Activity   Alcohol use: Yes    Comment:  social   Drug use: No   Sexual activity: Yes  Other Topics Concern   Not on file  Social History Narrative   ** Merged History Encounter **       Works two jobs, 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   Social Drivers of Health   Financial Resource Strain: Patient Declined (02/13/2023)   Received from [facility]   Overall Financial Resource Strain (CARDIA)    Difficulty of Paying Living Expenses: Patient declined  Food Insecurity: Patient Declined (02/13/2023)   Received from [facility]   Hunger Vital Sign    Within the past 12 months, you worried that your food would run out before you got the money to buy more.: Patient declined    Within the past 12 months, the food you bought just didn't last and you didn't have money to get more.: Patient declined  Transportation Needs: Patient Declined (02/13/2023)   Received from [facility] - [facility]    In the past 12 months, has lack of transportation kept you from medical appointments or from getting medications?: Patient declined    Lack of Transportation (Non-Medical): Patient declined  Physical Activity: Not on file  Stress: Not on file  Social Connections: Not on file  Intimate Partner Violence: Not on file    Vital Signs: Blood pressure 125/84, pulse 76, resp. rate 16, height 5' 5 (1.651 m), weight 179 lb (81.2 kg), SpO2 99%. Body mass index is 29.79 kg/m.   Examination: General Appearance: The patient is well-developed, well-nourished, and in no distress. Neck Circumference: 40 cm Skin: Gross inspection of skin unremarkable. Head: normocephalic, no gross deformities. Eyes: no gross deformities noted. ENT: ears appear grossly normal Neurologic: Alert and oriented. No involuntary movements.    STOP BANG RISK ASSESSMENT S (snore) Have you been told that you snore?     NO   T (tired) Are you often tired, fatigued, or sleepy during the day?    NO  O (obstruction) Do you stop breathing, choke, or gasp during sleep? NO   P (pressure) Do you have or are you being treated for high blood pressure? YES   B (BMI) Is your body index greater than 35 kg/m? NO   A (age) Are you 50 years old or older? NO   N (neck) Do you have a neck circumference greater than 16 inches?   NO   G (gender) Are you a female? NO   TOTAL STOP/BANG "YES" ANSWERS 1                                                               A STOP-Bang score of 2  or less is considered low risk, and a score of 5 or more is high risk for having either moderate or severe OSA. For people who score 3 or 4, doctors may need to perform further assessment to determine how likely they are to have OSA.         EPWORTH SLEEPINESS SCALE:  Scale:  (0)= no chance of dozing; (1)= slight chance of dozing; (2)= moderate chance of dozing; (3)= high chance of dozing  Chance  Situtation    Sitting and reading: 2    Watching TV: 2    Sitting Inactive in public: 0    As a passenger in car: 1      Lying down to rest: 2    Sitting and talking: 0    Sitting quielty after lunch: 2    In a car, stopped in traffic: 0   TOTAL SCORE:   9 out of 24    SLEEP STUDIES:  Titration (05/2023) CPAP@ 8 cmH2O HSS RDI 25/hour 02/21/23   LABS: No results found for this or any previous visit (from the past 2160 hours).  Radiology: MM 3D SCREENING MAMMOGRAM BILATERAL BREAST Result Date: 03/27/2023 CLINICAL DATA:  Screening. EXAM: DIGITAL SCREENING BILATERAL MAMMOGRAM WITH TOMOSYNTHESIS AND CAD TECHNIQUE: Bilateral screening digital craniocaudal and mediolateral oblique mammograms were obtained. Bilateral screening digital breast tomosynthesis was performed. The images were evaluated with computer-aided detection. COMPARISON:  Previous exam(s). ACR Breast Density Category c: The breasts are heterogeneously dense, which may obscure small masses. FINDINGS: There are no findings suspicious for  malignancy. IMPRESSION: No mammographic evidence of malignancy. A result letter of this screening mammogram will be mailed directly to the patient. RECOMMENDATION: Screening mammogram in one year. (Code:SM-B-01Y) BI-RADS CATEGORY  1: Negative. Electronically Signed   By: Almarie Daring M.D.   On: 03/27/2023 14:39    No results found.  No results found.    Assessment and Plan: Patient Active Problem List   Diagnosis Date Noted   OSA (obstructive sleep apnea) 03/29/2024   CPAP use counseling 03/29/2024   Restless leg syndrome 03/29/2024   Screening for colon cancer 09/24/2023   History of Helicobacter pylori infection 09/24/2023   Gastroesophageal reflux disease 09/24/2023   Sprain of ankle, left 06/13/2016   Labral tear of shoulder, degenerative, right, with large anterior labral cyst 05/15/2016   Right cervical radiculopathy 05/15/2016   HTN (hypertension) 05/11/2012   Left ventricular hypertrophy 04/07/2012   1. OSA (obstructive sleep apnea) (Primary) Patient evaluation reveals controlled apnea on 8 cm of cpap. Patient feels that her snoring has resolved and has no complaints other than mask leak. Patient has comorbid cardiovascular risk factors including: hypertension which could be exacerbated by pathologic sleep-disordered breathing.  Suggest: continue cpap at 8cm.f/u 64m.    2. CPAP use counseling CPAP Counseling: had a lengthy discussion with the patient regarding the importance of PAP therapy in management of the sleep apnea. Patient appears to understand the risk factor reduction and also understands the risks associated with untreated sleep apnea. Patient will try to make a good faith effort to remain compliant with therapy. Also instructed the patient on proper cleaning of the device including the water must be changed daily if possible and use of distilled water is preferred. Patient understands that the machine should be regularly cleaned with appropriate recommended  cleaning solutions that do not damage the PAP machine for example given white vinegar and water rinses. Other methods such as ozone treatment may not be as good  as these simple methods to achieve cleaning.   3. Hypertension, unspecified type Controlled with lisinopril/hydrochlorothiazide.   4. Restless leg syndrome Suggested magnesium glycinate.   General Counseling: I have discussed the findings of the evaluation and examination with Mirabelle.  I have also discussed any further diagnostic evaluation thatmay be needed or ordered today. Izella verbalizes understanding of the findings of todays visit. We also reviewed her medications today and discussed drug interactions and side effects including but not limited excessive drowsiness and altered mental states. We also discussed that there is always a risk not just to her but also people around her. she has been encouraged to call the office with any questions or concerns that should arise related to todays visit.  No orders of the defined types were placed in this encounter.       I have personally obtained a history, evaluated the patient, evaluated pertinent data, formulated the assessment and plan and placed orders.   This patient was seen today by Lauraine Lay, PA-C in collaboration with Dr. Elfreda Bathe.   Elfreda DELENA Bathe, MD [facility] Diplomate ABMS Pulmonary and Critical Care Medicine Sleep medicine

## 2024-03-29 ENCOUNTER — Ambulatory Visit (INDEPENDENT_AMBULATORY_CARE_PROVIDER_SITE_OTHER): Payer: Self-pay | Admitting: Internal Medicine

## 2024-03-29 VITALS — BP 125/84 | HR 76 | Resp 16 | Ht 65.0 in | Wt 179.0 lb

## 2024-03-29 DIAGNOSIS — Z7189 Other specified counseling: Secondary | ICD-10-CM | POA: Diagnosis not present

## 2024-03-29 DIAGNOSIS — I10 Essential (primary) hypertension: Secondary | ICD-10-CM

## 2024-03-29 DIAGNOSIS — G4733 Obstructive sleep apnea (adult) (pediatric): Secondary | ICD-10-CM | POA: Insufficient documentation

## 2024-03-29 DIAGNOSIS — G2581 Restless legs syndrome: Secondary | ICD-10-CM | POA: Insufficient documentation

## 2024-03-29 NOTE — Patient Instructions (Signed)

## 2024-04-09 ENCOUNTER — Telehealth: Payer: Self-pay | Admitting: Internal Medicine

## 2024-04-09 MED ORDER — OMEPRAZOLE 20 MG PO CPDR: 20.0000 mg | DELAYED_RELEASE_CAPSULE | Freq: Every day | ORAL | 6 refills | Status: AC

## 2024-04-09 NOTE — Telephone Encounter (Signed)
 Inbound call from patient requesting refill for prilosec. Requesting refill be sent to [facility] in [place] [address]. Please advise

## 2024-04-09 NOTE — Telephone Encounter (Signed)
Rx for omeprazole sent to pharmacy
